# Patient Record
Sex: Male | Born: 1955 | Race: White | Hispanic: No | Smoking: Former smoker
Health system: Southern US, Community
[De-identification: ages and names within clinical notes are randomized; demographics above are authoritative.]

## PROBLEM LIST (undated history)

## (undated) DIAGNOSIS — I4510 Unspecified right bundle-branch block: Secondary | ICD-10-CM

## (undated) DIAGNOSIS — I10 Essential (primary) hypertension: Secondary | ICD-10-CM

## (undated) DIAGNOSIS — Z8489 Family history of other specified conditions: Secondary | ICD-10-CM

## (undated) DIAGNOSIS — Z8719 Personal history of other diseases of the digestive system: Secondary | ICD-10-CM

## (undated) DIAGNOSIS — G8929 Other chronic pain: Secondary | ICD-10-CM

## (undated) DIAGNOSIS — R351 Nocturia: Secondary | ICD-10-CM

## (undated) DIAGNOSIS — M549 Dorsalgia, unspecified: Secondary | ICD-10-CM

## (undated) DIAGNOSIS — Z8711 Personal history of peptic ulcer disease: Secondary | ICD-10-CM

## (undated) DIAGNOSIS — K219 Gastro-esophageal reflux disease without esophagitis: Secondary | ICD-10-CM

## (undated) DIAGNOSIS — R531 Weakness: Secondary | ICD-10-CM

## (undated) HISTORY — PX: SPINAL FUSION: SHX223

## (undated) HISTORY — PX: KNEE ARTHROSCOPY: SUR90

## (undated) HISTORY — PX: BACK SURGERY: SHX140

## (undated) HISTORY — PX: LUMBAR FUSION: SHX111

## (undated) HISTORY — PX: ESOPHAGOGASTRODUODENOSCOPY: SHX1529

## (undated) HISTORY — DX: Essential (primary) hypertension: I10

---

## 2001-08-11 ENCOUNTER — Ambulatory Visit (HOSPITAL_COMMUNITY): Admission: RE | Admit: 2001-08-11 | Discharge: 2001-08-11 | Payer: Self-pay | Admitting: Family Medicine

## 2001-08-11 ENCOUNTER — Encounter: Payer: Self-pay | Admitting: Family Medicine

## 2001-08-18 ENCOUNTER — Encounter: Payer: Self-pay | Admitting: Family Medicine

## 2001-08-18 ENCOUNTER — Ambulatory Visit (HOSPITAL_COMMUNITY): Admission: RE | Admit: 2001-08-18 | Discharge: 2001-08-18 | Payer: Self-pay | Admitting: Family Medicine

## 2003-10-19 ENCOUNTER — Emergency Department (HOSPITAL_COMMUNITY): Admission: EM | Admit: 2003-10-19 | Discharge: 2003-10-19 | Payer: Self-pay | Admitting: Emergency Medicine

## 2013-05-16 ENCOUNTER — Emergency Department (HOSPITAL_COMMUNITY)
Admission: EM | Admit: 2013-05-16 | Discharge: 2013-05-16 | Disposition: A | Discharge status: Home / Self Care | Payer: BC Managed Care – PPO | Attending: Emergency Medicine | Admitting: Emergency Medicine

## 2013-05-16 ENCOUNTER — Emergency Department (HOSPITAL_COMMUNITY): Payer: BC Managed Care – PPO

## 2013-05-16 ENCOUNTER — Encounter (HOSPITAL_COMMUNITY): Payer: Self-pay

## 2013-05-16 DIAGNOSIS — W108XXA Fall (on) (from) other stairs and steps, initial encounter: Secondary | ICD-10-CM | POA: Insufficient documentation

## 2013-05-16 DIAGNOSIS — S2239XA Fracture of one rib, unspecified side, initial encounter for closed fracture: Secondary | ICD-10-CM | POA: Insufficient documentation

## 2013-05-16 DIAGNOSIS — R911 Solitary pulmonary nodule: Secondary | ICD-10-CM | POA: Insufficient documentation

## 2013-05-16 DIAGNOSIS — F17200 Nicotine dependence, unspecified, uncomplicated: Secondary | ICD-10-CM | POA: Insufficient documentation

## 2013-05-16 DIAGNOSIS — R059 Cough, unspecified: Secondary | ICD-10-CM | POA: Insufficient documentation

## 2013-05-16 DIAGNOSIS — R05 Cough: Secondary | ICD-10-CM | POA: Insufficient documentation

## 2013-05-16 DIAGNOSIS — S2232XA Fracture of one rib, left side, initial encounter for closed fracture: Secondary | ICD-10-CM

## 2013-05-16 DIAGNOSIS — R918 Other nonspecific abnormal finding of lung field: Secondary | ICD-10-CM

## 2013-05-16 DIAGNOSIS — Y9389 Activity, other specified: Secondary | ICD-10-CM | POA: Insufficient documentation

## 2013-05-16 DIAGNOSIS — Z9889 Other specified postprocedural states: Secondary | ICD-10-CM | POA: Insufficient documentation

## 2013-05-16 DIAGNOSIS — Y9289 Other specified places as the place of occurrence of the external cause: Secondary | ICD-10-CM | POA: Insufficient documentation

## 2013-05-16 LAB — BASIC METABOLIC PANEL
Calcium: 9.7 mg/dL (ref 8.4–10.5)
GFR calc Af Amer: >90 mL/min (ref >90)
GFR calc non Af Amer: >90 mL/min (ref >90)
Sodium: 137 mEq/L (ref 135–145)

## 2013-05-16 MED ORDER — DIAZEPAM 5 MG PO TABS
10.0000 mg | ORAL_TABLET | Freq: Once | ORAL | Status: AC
  Administered 2013-05-16: 10 mg via ORAL
  Filled 2013-05-16: qty 2

## 2013-05-16 MED ORDER — METHOCARBAMOL 500 MG PO TABS: 500.0000 mg | ORAL_TABLET | Freq: Three times a day (TID) | ORAL | Status: DC

## 2013-05-16 MED ORDER — HYDROCODONE-ACETAMINOPHEN 7.5-325 MG PO TABS: 1.0000 | ORAL_TABLET | ORAL | Status: DC | PRN

## 2013-05-16 MED ORDER — HYDROCODONE-ACETAMINOPHEN 5-325 MG PO TABS
2.0000 | ORAL_TABLET | Freq: Once | ORAL | Status: AC
  Administered 2013-05-16: 2 via ORAL
  Filled 2013-05-16: qty 2

## 2013-05-16 MED ORDER — IOHEXOL 300 MG/ML  SOLN
80.0000 mL | Freq: Once | INTRAMUSCULAR | Status: AC | PRN
  Administered 2013-05-16: 80 mL via INTRAVENOUS

## 2013-05-16 NOTE — ED Provider Notes (Signed)
CSN: [id number]     Arrival date & time 05/16/13  4098 History   First MD Initiated Contact with Patient 05/16/13 0830     Chief Complaint  Patient presents with  . Back Pain   (Consider location/radiation/quality/duration/timing/severity/associated sxs/prior Treatment) Patient is a 57 y.o. male presenting with flank pain. The history is provided by the patient.  Flank Pain This is a new problem. The current episode started in the past 7 days. The problem occurs constantly. The problem has been gradually worsening. Associated symptoms include arthralgias and coughing. Pertinent negatives include no abdominal pain, chest pain, fever, headaches, nausea, neck pain, numbness or vomiting. The symptoms are aggravated by bending, twisting and walking. He has tried acetaminophen and rest for the symptoms.    History reviewed. No pertinent past medical history. Past Surgical History  Procedure Laterality Date  . Back surgery     No family history on file. History  Substance Use Topics  . Smoking status: Current Every Day Smoker  . Smokeless tobacco: Not on file  . Alcohol Use: Yes     Comment: 4-5 beers daily    Review of Systems  Constitutional: Negative for fever and activity change.       All ROS Neg except as noted in HPI  HENT: Negative for nosebleeds and neck pain.   Eyes: Negative for photophobia and discharge.  Respiratory: Positive for cough. Negative for shortness of breath and wheezing.   Cardiovascular: Negative for chest pain and palpitations.  Gastrointestinal: Negative for nausea, vomiting, abdominal pain and blood in stool.  Genitourinary: Positive for flank pain. Negative for dysuria, frequency and hematuria.  Musculoskeletal: Positive for back pain and arthralgias.  Skin: Negative.   Neurological: Negative for dizziness, seizures, speech difficulty, numbness and headaches.  Psychiatric/Behavioral: Negative for hallucinations and confusion.    Allergies  Review of  patient's allergies indicates no known allergies.  Home Medications   Current Outpatient Rx  Name  Route  Sig  Dispense  Refill  . diphenhydramine-acetaminophen (TYLENOL PM) 25-500 MG TABS   Oral   Take 1 tablet by mouth at bedtime as needed (sleep/pain).          BP 166/96  Pulse 82  Temp(Src) 97.7 F (36.5 C) (Oral)  Resp 18  SpO2 96% Physical Exam  Nursing note and vitals reviewed. Constitutional: He is oriented to person, place, and time. He appears well-developed and well-nourished.  Non-toxic appearance.  HENT:  Head: Normocephalic.  Right Ear: Tympanic membrane and external ear normal.  Left Ear: Tympanic membrane and external ear normal.  Eyes: EOM and lids are normal. Pupils are equal, round, and reactive to light.  Neck: Normal range of motion. Neck supple. Carotid bruit is not present.  Cardiovascular: Normal rate, regular rhythm, normal heart sounds, intact distal pulses and normal pulses.   Pulmonary/Chest: No respiratory distress. He has decreased breath sounds in the left lower field.  Decrease breath sound at the base of the left lung.  Abdominal: Soft. Bowel sounds are normal. There is no tenderness. There is no guarding.  Musculoskeletal: Normal range of motion.       Arms: Lymphadenopathy:       Head (right side): No submandibular adenopathy present.       Head (left side): No submandibular adenopathy present.    He has no cervical adenopathy.  Neurological: He is alert and oriented to person, place, and time. He has normal strength. No cranial nerve deficit or sensory deficit.  Skin: Skin is  warm and dry.  Psychiatric: He has a normal mood and affect. His speech is normal.    ED Course  Procedures (including critical care time) Labs Review Labs Reviewed - No data to display Imaging Review Dg Ribs Unilateral W/chest Left  05/16/2013   CLINICAL DATA:  Fall. Left lower posterior rib pain.  EXAM: LEFT RIBS AND CHEST - 3+ VIEW  COMPARISON:  10/19/2003  chest radiograph  FINDINGS: Abnormal band of opacity noted in the right mid lung. Thoracic spondylosis noted. 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  Suspected acute left 11th rib fracture posteriorly  No pneumothorax or pleural effusion.  IMPRESSION: 1. Suspected acute left 11th rib fracture posteriorly. 2. Bandlike opacity in the right mid lung. 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 The appearance is more striking than on the prior exam, and CT scan of the chest with contrast is recommended to exclude malignancy.   Electronically Signed   By: Herbie Baltimore   On: 05/16/2013 09:08    MDM  No diagnosis found. *I have reviewed nursing notes, vital signs, and all appropriate lab and imaging results for this patient.**  Pt sustained a fall 4 days ago and back/flank pain is getting worse. There is pain with movement and pain with deep breathing. Chest xray suggested a bandlike opacity in the right mild lung. Xray of the rib suggest fx of the 11th posterior rib. CT scan notes small nodules of the subpleural aspect of the RML. Repeat CT was suggested in 6 months. Results given to the patient. Pain improving with vlium and norco. Rx for norco and robaxin given to the patient. He will have his PCP follow up with the reveat CT scans.  Kathie Dike, PA-C 05/18/13 1758   Medical screening examination/treatment/procedure(s) were performed by non-physician practitioner and as supervising physician I was immediately available for consultation/collaboration.  Donnetta Hutching, MD 06/11/13 1053

## 2013-05-16 NOTE — ED Notes (Signed)
Pt reports was going up 2 stairs Tuesday night and left knee gave out and he fell backwards.  Reports knee has been "giving out" for the past 2 months.  PT c/o pain in posterior ribs.  Pain much worse with movement and deep breathing.  Denies any SOB.

## 2013-06-04 ENCOUNTER — Encounter: Payer: Self-pay | Admitting: Orthopedic Surgery

## 2013-06-04 ENCOUNTER — Ambulatory Visit (INDEPENDENT_AMBULATORY_CARE_PROVIDER_SITE_OTHER): Payer: BC Managed Care – PPO

## 2013-06-04 ENCOUNTER — Ambulatory Visit (INDEPENDENT_AMBULATORY_CARE_PROVIDER_SITE_OTHER): Payer: BC Managed Care – PPO | Admitting: Orthopedic Surgery

## 2013-06-04 VITALS — BP 162/101 | Ht 72.0 in | Wt 212.0 lb

## 2013-06-04 DIAGNOSIS — M25562 Pain in left knee: Secondary | ICD-10-CM

## 2013-06-04 DIAGNOSIS — M25569 Pain in unspecified knee: Secondary | ICD-10-CM

## 2013-06-04 DIAGNOSIS — M6250 Muscle wasting and atrophy, not elsewhere classified, unspecified site: Secondary | ICD-10-CM

## 2013-06-04 DIAGNOSIS — R29898 Other symptoms and signs involving the musculoskeletal system: Secondary | ICD-10-CM

## 2013-06-04 DIAGNOSIS — M62569 Muscle wasting and atrophy, not elsewhere classified, unspecified lower leg: Secondary | ICD-10-CM | POA: Insufficient documentation

## 2013-06-04 NOTE — Progress Notes (Signed)
  Subjective:    Austin Calderon is a 57 y.o. male who presents with gradual onset over the last 3-4 months of giving out symptoms and pain in his left knee. He seems to get better after walking. Pain is worse at night. He reports a sharp pain intermittently in his knee shooting through both sides which will last a couple of days finger way. No signs of trauma. The following portions of the patient's history were reviewed and updated as appropriate: allergies, current medications, past family history, past medical history, past social history, past surgical history and problem list.   Review of Systems A comprehensive review of systems was negative.   Objective:    BP 162/101  Ht 6' (1.829 m)  Wt 212 lb (96.163 kg)  BMI 28.75 kg/m2 General appearance is normal, the patient is alert and oriented x3 with normal mood and affect. Is a mature status is normal no assisted devices no bracing  His left leg shows atrophy of the half inch compared to the right has normal range of motion stability in the left leg with normal skin negative McMurray sign normal cardiovascular signs and normal sensation, we DC atrophy.        X-ray left knee: shows DJD changes, likely chronic    Assessment:   Encounter Diagnoses  Name Primary?  . Left knee pain Yes  . Weakness of left leg   . Muscle wasting and atrophy, NEC, unsp lower leg      Plan:   He has weakness and atrophy in the left quadriceps which would explain his normal ligament exam negative cartilage tear exam and giving out episodes which is most likely related to his lumbar spine problem  Recommend home exercises

## 2013-06-04 NOTE — Patient Instructions (Signed)
Knee Exercises EXERCISES RANGE OF MOTION(ROM) AND STRETCHING EXERCISES These exercises may help you when beginning to rehabilitate your injury. Your symptoms may resolve with or without further involvement from your physician, physical therapist or athletic trainer. While completing these exercises, remember:    Restoring tissue flexibility helps normal motion to return to the joints. This allows healthier, less painful movement and activity.   An effective stretch should be held for at least 30 seconds.   A stretch should never be painful. You should only feel a gentle lengthening or release in the stretched tissue.  STRETCH - Knee Extension, Prone  Lie on your stomach on a firm surface, such as a bed or countertop. Place your right / left knee and leg just beyond the edge of the surface. You may wish to place a towel under the far end of your right / left thigh for comfort.   Relax your leg muscles and allow gravity to straighten your knee. Your clinician may advise you to add an ankle weight if more resistance is helpful for you.  You should feel a stretch in the back of your right / left knee.  RANGE OF MOTION - Knee Flexion, Active  Lie on your back with both knees straight. (If this causes back discomfort, bend your opposite knee, placing your foot flat on the floor.)   Slowly slide your heel back toward your buttocks until you feel a gentle stretch in the front of your knee or thigh.     STRETCH - Quadriceps, Prone   Lie on your stomach on a firm surface, such as a bed or padded floor.   Bend your right / left knee and grasp your ankle. If you are unable to reach, your ankle or pant leg, use a belt around your foot to lengthen your reach.   Gently pull your heel toward your buttocks. Your knee should not slide out to the side. You should feel a stretch in the front of your thigh and/or knee.   STRETCH  Hamstrings, Supine   Lie on your back. Loop a belt or towel over the ball of  your right / left foot.   Straighten your right / left knee and slowly pull on the belt to raise your leg. Do not allow the right / left knee to bend. Keep your opposite leg flat on the floor.  Raise the leg until you feel a gentle stretch behind your right / left knee or thigh.  STRENGTHENING EXERCISES These exercises may help you when beginning to rehabilitate your injury. They may resolve your symptoms with or without further involvement from your physician, physical therapist or athletic trainer. While completing these exercises, remember:    Muscles can gain both the endurance and the strength needed for everyday activities through controlled exercises.   Complete these exercises as instructed by your physician, physical therapist or athletic trainer. Progress the resistance and repetitions only as guided.   You may experience muscle soreness or fatigue, but the pain or discomfort you are trying to eliminate should never worsen during these exercises. If this pain does worsen, stop and make certain you are following the directions exactly. If the pain is still present after adjustments, discontinue the exercise until you can discuss the trouble with your clinician.  STRENGTH - Quadriceps, Isometrics  Lie on your back with your right / left leg extended and your opposite knee bent.   Gradually tense the muscles in the front of your right / left   thigh. You should see either your knee cap slide up toward your hip or increased dimpling just above the knee. This motion will push the back of the knee down toward the floor/mat/bed on which you are lying.   STRENGTH - Quadriceps, Short Arcs   Lie on your back. Place a rolled  towel roll under your knee so that the knee slightly bends.   Raise only your lower leg by tightening the muscles in the front of your thigh. Do not allow your thigh to rise.     STRENGTH - Quadriceps, Straight Leg Raises  Quality counts! Watch for signs that the  quadriceps muscle is working to insure you are strengthening the correct muscles and not "cheating" by substituting with healthier muscles.  Lay on your back with your right / left leg extended and your opposite knee bent.   Tense the muscles in the front of your right / left thigh. You should see either your knee cap slide up or increased dimpling just above the knee. Your thigh may even quiver.  Tighten these muscles even more and raise your leg 4 to 6 inches off the floor.   STRENGTH - Hamstring, Curls  Lay on your stomach with your legs extended. (If you lay on a bed, your feet may hang over the edge.)   Tighten the muscles in the back of your thigh to bend your right / left knee up to 90 degrees. Keep your hips flat on the bed/floor.    STRENGTH  Quadriceps, Squats  Stand in a door frame so that your feet and knees are in line with the frame.   Use your hands for balance, not support, on the frame.   Slowly lower your weight, bending at the hips and knees. Keep your lower legs upright so that they are parallel with the door frame. Squat only within the range that does not increase your knee pain. Never let your hips drop below your knees.   Slowly return upright, pushing with your legs, not pulling with your hands.   STRENGTH - Quadriceps, Wall Slides  Follow guidelines for form closely. Increased knee pain often results from poorly placed feet or knees.  Lean against a smooth wall or door and walk your feet out 18-24 inches. Place your feet hip-width apart.   Slowly slide down the wall or door until your knees bend _30________ degrees.* Keep your knees over your heels, not your toes, and in line with your hips, not falling to either side.   * Your physician, physical therapist or athletic trainer will alter this angle based on your symptoms and progress. Document Released: 07/11/2005 Document Revised: 11/19/2011 Document Reviewed: 12/09/2008 [employer] Patient Information 2013  [employer]    

## 2014-01-11 ENCOUNTER — Ambulatory Visit (HOSPITAL_COMMUNITY)
Admission: RE | Admit: 2014-01-11 | Discharge: 2014-01-11 | Disposition: A | Discharge status: Home / Self Care | Payer: BC Managed Care – PPO | Source: Ambulatory Visit | Attending: Family Medicine | Admitting: Family Medicine

## 2014-01-11 ENCOUNTER — Other Ambulatory Visit (HOSPITAL_COMMUNITY): Payer: Self-pay | Admitting: Family Medicine

## 2014-01-11 DIAGNOSIS — R509 Fever, unspecified: Secondary | ICD-10-CM | POA: Insufficient documentation

## 2014-01-11 DIAGNOSIS — J209 Acute bronchitis, unspecified: Secondary | ICD-10-CM

## 2014-01-11 DIAGNOSIS — R05 Cough: Secondary | ICD-10-CM | POA: Insufficient documentation

## 2014-01-11 DIAGNOSIS — R059 Cough, unspecified: Secondary | ICD-10-CM | POA: Insufficient documentation

## 2014-06-29 ENCOUNTER — Other Ambulatory Visit (HOSPITAL_COMMUNITY): Payer: Self-pay | Admitting: Sports Medicine

## 2014-06-29 DIAGNOSIS — M545 Low back pain: Secondary | ICD-10-CM

## 2014-06-29 DIAGNOSIS — M79604 Pain in right leg: Secondary | ICD-10-CM

## 2014-07-01 ENCOUNTER — Encounter (HOSPITAL_COMMUNITY): Payer: Self-pay

## 2014-07-01 ENCOUNTER — Ambulatory Visit (HOSPITAL_COMMUNITY)
Admission: RE | Admit: 2014-07-01 | Discharge: 2014-07-01 | Disposition: A | Discharge status: Home / Self Care | Payer: BC Managed Care – PPO | Source: Ambulatory Visit | Attending: Sports Medicine | Admitting: Sports Medicine

## 2014-07-01 DIAGNOSIS — M4806 Spinal stenosis, lumbar region: Secondary | ICD-10-CM | POA: Insufficient documentation

## 2014-07-01 DIAGNOSIS — Z981 Arthrodesis status: Secondary | ICD-10-CM | POA: Diagnosis not present

## 2014-07-01 DIAGNOSIS — M545 Low back pain: Secondary | ICD-10-CM | POA: Diagnosis present

## 2014-07-01 DIAGNOSIS — M79604 Pain in right leg: Secondary | ICD-10-CM

## 2014-07-01 MED ORDER — GADOBENATE DIMEGLUMINE 529 MG/ML IV SOLN
20.0000 mL | Freq: Once | INTRAVENOUS | Status: AC | PRN
  Administered 2014-07-01: 20 mL via INTRAVENOUS

## 2014-08-17 ENCOUNTER — Other Ambulatory Visit: Payer: Self-pay | Admitting: Neurosurgery

## 2014-08-17 DIAGNOSIS — S32009K Unspecified fracture of unspecified lumbar vertebra, subsequent encounter for fracture with nonunion: Secondary | ICD-10-CM

## 2014-08-24 ENCOUNTER — Ambulatory Visit
Admission: RE | Admit: 2014-08-24 | Discharge: 2014-08-24 | Disposition: A | Discharge status: Home / Self Care | Payer: BC Managed Care – PPO | Source: Ambulatory Visit | Attending: Neurosurgery | Admitting: Neurosurgery

## 2014-08-24 ENCOUNTER — Other Ambulatory Visit: Payer: BC Managed Care – PPO

## 2014-08-24 DIAGNOSIS — R29898 Other symptoms and signs involving the musculoskeletal system: Secondary | ICD-10-CM

## 2014-08-24 DIAGNOSIS — S32009K Unspecified fracture of unspecified lumbar vertebra, subsequent encounter for fracture with nonunion: Secondary | ICD-10-CM

## 2014-08-24 DIAGNOSIS — M25562 Pain in left knee: Secondary | ICD-10-CM

## 2014-08-24 MED ORDER — IOHEXOL 180 MG/ML  SOLN
15.0000 mL | Freq: Once | INTRAMUSCULAR | Status: AC | PRN
  Administered 2014-08-24: 15 mL via INTRATHECAL

## 2014-08-24 MED ORDER — DIAZEPAM 5 MG PO TABS
10.0000 mg | ORAL_TABLET | Freq: Once | ORAL | Status: AC
  Administered 2014-08-24: 10 mg via ORAL

## 2014-08-24 NOTE — Discharge Instructions (Signed)

## 2014-08-26 ENCOUNTER — Other Ambulatory Visit: Payer: BC Managed Care – PPO

## 2014-10-04 ENCOUNTER — Other Ambulatory Visit: Payer: Self-pay | Admitting: Neurosurgery

## 2014-10-07 ENCOUNTER — Encounter (HOSPITAL_COMMUNITY): Payer: Self-pay

## 2014-10-07 ENCOUNTER — Other Ambulatory Visit: Payer: Self-pay

## 2014-10-07 ENCOUNTER — Encounter (HOSPITAL_COMMUNITY)
Admission: RE | Admit: 2014-10-07 | Discharge: 2014-10-07 | Disposition: A | Discharge status: Home / Self Care | Payer: BLUE CROSS/BLUE SHIELD | Source: Ambulatory Visit | Attending: Neurosurgery | Admitting: Neurosurgery

## 2014-10-07 HISTORY — DX: Gastro-esophageal reflux disease without esophagitis: K21.9

## 2014-10-07 HISTORY — DX: Weakness: R53.1

## 2014-10-07 HISTORY — DX: Family history of other specified conditions: Z84.89

## 2014-10-07 HISTORY — DX: Personal history of other diseases of the digestive system: Z87.19

## 2014-10-07 HISTORY — DX: Nocturia: R35.1

## 2014-10-07 HISTORY — DX: Other chronic pain: G89.29

## 2014-10-07 HISTORY — DX: Dorsalgia, unspecified: M54.9

## 2014-10-07 HISTORY — DX: Personal history of peptic ulcer disease: Z87.11

## 2014-10-07 LAB — CBC WITH DIFFERENTIAL/PLATELET
Basophils Absolute: 0.1 10*3/uL (ref 0.0–0.1)
Basophils Relative: 1 % (ref 0–1)
EOS PCT: 3 % (ref 0–5)
Eosinophils Absolute: 0.3 10*3/uL (ref 0.0–0.7)
HCT: 43.8 % (ref 39.0–52.0)
HEMOGLOBIN: 14.7 g/dL (ref 13.0–17.0)
LYMPHS ABS: 2.8 10*3/uL (ref 0.7–4.0)
Lymphocytes Relative: 30 % (ref 12–46)
MCH: 32.3 pg (ref 26.0–34.0)
MCHC: 33.6 g/dL (ref 30.0–36.0)
MCV: 96.3 fL (ref 78.0–100.0)
Monocytes Absolute: 0.8 10*3/uL (ref 0.1–1.0)
Monocytes Relative: 9 % (ref 3–12)
Neutro Abs: 5.4 10*3/uL (ref 1.7–7.7)
Neutrophils Relative %: 57 % (ref 43–77)
Platelets: 259 10*3/uL (ref 150–400)
RBC: 4.55 MIL/uL (ref 4.22–5.81)
RDW: 12.4 % (ref 11.5–15.5)
WBC: 9.5 10*3/uL (ref 4.0–10.5)

## 2014-10-07 LAB — BASIC METABOLIC PANEL
Anion gap: 8 (ref 5–15)
BUN: 10 mg/dL (ref 6–23)
CO2: 27 mmol/L (ref 19–32)
Calcium: 9.3 mg/dL (ref 8.4–10.5)
Chloride: 104 mmol/L (ref 96–112)
Creatinine, Ser: 1.07 mg/dL (ref 0.50–1.35)
GFR calc Af Amer: 87 mL/min — ABNORMAL LOW (ref >90)
GFR, EST NON AFRICAN AMERICAN: 75 mL/min — AB (ref >90)
Glucose, Bld: 107 mg/dL — ABNORMAL HIGH (ref 70–99)
Potassium: 4.2 mmol/L (ref 3.5–5.1)
Sodium: 139 mmol/L (ref 135–145)

## 2014-10-07 LAB — TYPE AND SCREEN
ABO/RH(D): O NEG
Antibody Screen: NEGATIVE

## 2014-10-07 LAB — SURGICAL PCR SCREEN
MRSA, PCR: NEGATIVE
STAPHYLOCOCCUS AUREUS: NEGATIVE

## 2014-10-07 LAB — ABO/RH: ABO/RH(D): O NEG

## 2014-10-07 NOTE — Pre-Procedure Instructions (Signed)
Austin Calderon  10/07/2014   Your procedure is scheduled on:  Mon, Feb 1 @ 7:45 AM  Report to [facility] at 5:30 AM.  Call this number if you have problems the morning of surgery: [phone]   Remember:   Do not eat food or drink liquids after midnight.   Take these medicines the morning of surgery with A SIP OF WATER: Pain Pill(if needed)               No Goody's,BC's,Aleve,Aspirin,Ibuprofen,Fish Oil,or any Herbal Medications   Do not wear jewelry  Do not wear lotions, powders, or colognes. You may wear deodorant.  Men may shave face and neck.  Do not bring valuables to the hospital.  [facility] is not responsible                  for any belongings or valuables.               Contacts, dentures or bridgework may not be worn into surgery.  Leave suitcase in the car. After surgery it may be brought to your room.  For patients admitted to the hospital, discharge time is determined by your                treatment team.                 Special Instructions:   - Preparing for Surgery  Before surgery, you can play an important role.  Because skin is not sterile, your skin needs to be as free of germs as possible.  You can reduce the number of germs on you skin by washing with CHG (chlorahexidine gluconate) soap before surgery.  CHG is an antiseptic cleaner which kills germs and bonds with the skin to continue killing germs even after washing.  Please DO NOT use if you have an allergy to CHG or antibacterial soaps.  If your skin becomes reddened/irritated stop using the CHG and inform your nurse when you arrive at Short Stay.  Do not shave (including legs and underarms) for at least 48 hours prior to the first CHG shower.  You may shave your face.  Please follow these instructions carefully:   1.  Shower with CHG Soap the night before surgery and the                                morning of Surgery.  2.  If you choose to wash your hair, wash your hair first  as usual with your       normal shampoo.  3.  After you shampoo, rinse your hair and body thoroughly to remove the                      Shampoo.  4.  Use CHG as you would any other liquid soap.  You can apply chg directly       to the skin and wash gently with scrungie or a clean washcloth.  5.  Apply the CHG Soap to your body ONLY FROM THE NECK DOWN.        Do not use on open wounds or open sores.  Avoid contact with your eyes,       ears, mouth and genitals (private parts).  Wash genitals (private parts)       with your normal soap.  6.  Wash thoroughly, paying special  attention to the area where your surgery        will be performed.  7.  Thoroughly rinse your body with warm water from the neck down.  8.  DO NOT shower/wash with your normal soap after using and rinsing off       the CHG Soap.  9.  Pat yourself dry with a clean towel.            10.  Wear clean pajamas.            11.  Place clean sheets on your bed the night of your first shower and do not        sleep with pets.  Day of Surgery  Do not apply any lotions/deoderants the morning of surgery.  Please wear clean clothes to the hospital/surgery center.     Please read over the following fact sheets that you were given: Pain Booklet, Coughing and Deep Breathing, Blood Transfusion Information, MRSA Information and Surgical Site Infection Prevention

## 2014-10-07 NOTE — Progress Notes (Signed)
Pt doesn't have a cardiologist  Medical Md is Dr.Fusco   Denies EKG in past yr  CXR in epic from 01-11-14  Denies ever having an echo/stress test/heart cath

## 2014-10-07 NOTE — Pre-Procedure Instructions (Signed)
Austin Calderon  10/07/2014   Your procedure is scheduled on:  Monday, Feb. 1, 2016   Report to [facility] Admitting at 5:30 AM.  Call this number if you have problems the morning of surgery: [phone]   Remember:   Do not eat food or drink liquids after midnight Sunday.   Take these medicines the morning of surgery with A SIP OF WATER: Robaxin, Norco.   Do not wear jewelry - no rings or watches.  Do not wear lotions or colognes.  You may NOT wear deodorant the day surgery.   Men may shave face and neck.  Do not bring valuables to the hospital.  [facility] is not responsible for any belongings or valuables.               Contacts, dentures or bridgework may not be worn into surgery.  Leave suitcase in the car. After surgery it may be brought to your room.  For patients admitted to the hospital, discharge time is determined by your treatment team.    Name and phone number of your driver:    Special Instructions: "Preparing for Surgery" instruction sheet.   Please read over the following fact sheets that you were given: Pain Booklet, Coughing and Deep Breathing, Blood Transfusion Information, MRSA Information and Surgical Site Infection Prevention

## 2014-10-08 NOTE — Progress Notes (Signed)
Anesthesia Chart Review:  Patient is a 59 year old male scheduled for L4-5 PLIF on 10/11/14 by Dr. Annette Stable.    History includes former smoker, HTN, GERD, spinal fusion. He admits to drinking 4-5 beers per day. PCP is Dr. Gerarda Fraction.    10/07/14 EKG: NSR with sinus arrhythmia, right BBB. No CV symptoms documented at his PAT visit.  Denied prior echo, stress or cath.  No comparison EKGs in [place], [place], or at [facility].   01/11/14 CXR: No active cardiopulmonary disease.  Preoperative labs noted.   Patient with right BBB of undetermined durations. No known CAD/MI/CHF or DM history.  Further evaluation by his assigned anesthesiologist on the day of surgery, but if no acute changes or new CV symptoms then I would anticipate that he could proceed as planned.  George [facility] Short Stay Center/Anesthesiology Phone [phone] 10/08/2014 11:39 AM

## 2014-10-10 MED ORDER — CEFAZOLIN SODIUM-DEXTROSE 2-3 GM-% IV SOLR
2.0000 g | INTRAVENOUS | Status: AC
  Administered 2014-10-11: 2 g via INTRAVENOUS
  Filled 2014-10-10: qty 50

## 2014-10-10 MED ORDER — DEXAMETHASONE SODIUM PHOSPHATE 10 MG/ML IJ SOLN
10.0000 mg | INTRAMUSCULAR | Status: AC
  Administered 2014-10-11: 10 mg via INTRAVENOUS
  Filled 2014-10-10: qty 1

## 2014-10-11 ENCOUNTER — Inpatient Hospital Stay (HOSPITAL_COMMUNITY): Payer: BLUE CROSS/BLUE SHIELD | Admitting: Vascular Surgery

## 2014-10-11 ENCOUNTER — Encounter (HOSPITAL_COMMUNITY)
Admission: RE | Disposition: A | Discharge status: Home / Self Care | Payer: Self-pay | Source: Ambulatory Visit | Attending: Neurosurgery

## 2014-10-11 ENCOUNTER — Inpatient Hospital Stay (HOSPITAL_COMMUNITY): Payer: BLUE CROSS/BLUE SHIELD | Admitting: Anesthesiology

## 2014-10-11 ENCOUNTER — Inpatient Hospital Stay (HOSPITAL_COMMUNITY)
Admission: RE | Admit: 2014-10-11 | Discharge: 2014-10-12 | DRG: 460 | Disposition: A | Discharge status: Home / Self Care | Payer: BLUE CROSS/BLUE SHIELD | Source: Ambulatory Visit | Attending: Neurosurgery | Admitting: Neurosurgery

## 2014-10-11 ENCOUNTER — Encounter (HOSPITAL_COMMUNITY): Payer: Self-pay | Admitting: *Deleted

## 2014-10-11 ENCOUNTER — Inpatient Hospital Stay (HOSPITAL_COMMUNITY): Payer: BLUE CROSS/BLUE SHIELD

## 2014-10-11 DIAGNOSIS — I10 Essential (primary) hypertension: Secondary | ICD-10-CM | POA: Diagnosis present

## 2014-10-11 DIAGNOSIS — Y838 Other surgical procedures as the cause of abnormal reaction of the patient, or of later complication, without mention of misadventure at the time of the procedure: Secondary | ICD-10-CM | POA: Diagnosis present

## 2014-10-11 DIAGNOSIS — S32009K Unspecified fracture of unspecified lumbar vertebra, subsequent encounter for fracture with nonunion: Secondary | ICD-10-CM | POA: Diagnosis present

## 2014-10-11 DIAGNOSIS — M960 Pseudarthrosis after fusion or arthrodesis: Principal | ICD-10-CM | POA: Diagnosis present

## 2014-10-11 DIAGNOSIS — K219 Gastro-esophageal reflux disease without esophagitis: Secondary | ICD-10-CM | POA: Diagnosis present

## 2014-10-11 DIAGNOSIS — Z419 Encounter for procedure for purposes other than remedying health state, unspecified: Secondary | ICD-10-CM

## 2014-10-11 DIAGNOSIS — G8929 Other chronic pain: Secondary | ICD-10-CM | POA: Diagnosis present

## 2014-10-11 DIAGNOSIS — M4806 Spinal stenosis, lumbar region: Secondary | ICD-10-CM | POA: Diagnosis present

## 2014-10-11 DIAGNOSIS — I4510 Unspecified right bundle-branch block: Secondary | ICD-10-CM | POA: Diagnosis present

## 2014-10-11 DIAGNOSIS — M549 Dorsalgia, unspecified: Secondary | ICD-10-CM | POA: Diagnosis present

## 2014-10-11 DIAGNOSIS — M5489 Other dorsalgia: Secondary | ICD-10-CM | POA: Diagnosis present

## 2014-10-11 DIAGNOSIS — Z87891 Personal history of nicotine dependence: Secondary | ICD-10-CM

## 2014-10-11 DIAGNOSIS — Z981 Arthrodesis status: Secondary | ICD-10-CM

## 2014-10-11 HISTORY — DX: Unspecified right bundle-branch block: I45.10

## 2014-10-11 SURGERY — POSTERIOR LUMBAR FUSION 1 LEVEL
Anesthesia: General | Site: Spine Lumbar

## 2014-10-11 MED ORDER — VANCOMYCIN HCL 1000 MG IV SOLR
INTRAVENOUS | Status: AC
  Filled 2014-10-11: qty 1000

## 2014-10-11 MED ORDER — MIDAZOLAM HCL 2 MG/2ML IJ SOLN
INTRAMUSCULAR | Status: AC
  Filled 2014-10-11: qty 2

## 2014-10-11 MED ORDER — SODIUM CHLORIDE 0.9 % IJ SOLN
3.0000 mL | Freq: Two times a day (BID) | INTRAMUSCULAR | Status: DC
  Administered 2014-10-11 (×2): 3 mL via INTRAVENOUS

## 2014-10-11 MED ORDER — LACTATED RINGERS IV SOLN
INTRAVENOUS | Status: DC | PRN
  Administered 2014-10-11 (×3): via INTRAVENOUS

## 2014-10-11 MED ORDER — 0.9 % SODIUM CHLORIDE (POUR BTL) OPTIME
TOPICAL | Status: DC | PRN
  Administered 2014-10-11: 1000 mL

## 2014-10-11 MED ORDER — BUPIVACAINE HCL (PF) 0.25 % IJ SOLN
INTRAMUSCULAR | Status: DC | PRN
  Administered 2014-10-11: 20 mL

## 2014-10-11 MED ORDER — ONDANSETRON HCL 4 MG/2ML IJ SOLN
INTRAMUSCULAR | Status: DC | PRN
  Administered 2014-10-11: 4 mg via INTRAVENOUS

## 2014-10-11 MED ORDER — LIDOCAINE HCL (CARDIAC) 20 MG/ML IV SOLN
INTRAVENOUS | Status: AC
  Filled 2014-10-11: qty 5

## 2014-10-11 MED ORDER — ARTIFICIAL TEARS OP OINT
TOPICAL_OINTMENT | OPHTHALMIC | Status: DC | PRN
  Administered 2014-10-11: 1 via OPHTHALMIC

## 2014-10-11 MED ORDER — PROPOFOL 10 MG/ML IV BOLUS
INTRAVENOUS | Status: DC | PRN
  Administered 2014-10-11: 200 mg via INTRAVENOUS

## 2014-10-11 MED ORDER — OXYCODONE HCL 5 MG/5ML PO SOLN: 5.0000 mg | Freq: Once | ORAL | Status: DC | PRN

## 2014-10-11 MED ORDER — MENTHOL 3 MG MT LOZG: 1.0000 | LOZENGE | OROMUCOSAL | Status: DC | PRN

## 2014-10-11 MED ORDER — OXYCODONE-ACETAMINOPHEN 5-325 MG PO TABS
ORAL_TABLET | ORAL | Status: AC
  Administered 2014-10-11: 2 via ORAL
  Filled 2014-10-11: qty 2

## 2014-10-11 MED ORDER — SENNA 8.6 MG PO TABS
1.0000 | ORAL_TABLET | Freq: Two times a day (BID) | ORAL | Status: DC
  Administered 2014-10-11: 8.6 mg via ORAL
  Filled 2014-10-11 (×3): qty 1

## 2014-10-11 MED ORDER — ACETAMINOPHEN 325 MG PO TABS: 650.0000 mg | ORAL_TABLET | ORAL | Status: DC | PRN

## 2014-10-11 MED ORDER — ALUM & MAG HYDROXIDE-SIMETH 200-200-20 MG/5ML PO SUSP: 30.0000 mL | Freq: Four times a day (QID) | ORAL | Status: DC | PRN

## 2014-10-11 MED ORDER — FENTANYL CITRATE 0.05 MG/ML IJ SOLN
INTRAMUSCULAR | Status: AC
  Filled 2014-10-11: qty 5

## 2014-10-11 MED ORDER — ARTIFICIAL TEARS OP OINT
TOPICAL_OINTMENT | OPHTHALMIC | Status: AC
  Filled 2014-10-11: qty 3.5

## 2014-10-11 MED ORDER — DIAZEPAM 5 MG PO TABS
5.0000 mg | ORAL_TABLET | Freq: Four times a day (QID) | ORAL | Status: DC | PRN
Start: 2014-10-11 — End: 2014-10-12
  Administered 2014-10-11: 5 mg via ORAL
  Administered 2014-10-11 – 2014-10-12 (×2): 10 mg via ORAL
  Filled 2014-10-11 (×2): qty 2

## 2014-10-11 MED ORDER — THROMBIN 20000 UNITS EX SOLR
CUTANEOUS | Status: DC | PRN
  Administered 2014-10-11: 20 mL via TOPICAL

## 2014-10-11 MED ORDER — ONDANSETRON HCL 4 MG/2ML IJ SOLN: 4.0000 mg | INTRAMUSCULAR | Status: DC | PRN

## 2014-10-11 MED ORDER — DIAZEPAM 5 MG PO TABS
ORAL_TABLET | ORAL | Status: AC
  Administered 2014-10-11: 5 mg via ORAL
  Filled 2014-10-11: qty 1

## 2014-10-11 MED ORDER — ALBUMIN HUMAN 5 % IV SOLN
INTRAVENOUS | Status: DC | PRN
  Administered 2014-10-11: 09:00:00 via INTRAVENOUS

## 2014-10-11 MED ORDER — FENTANYL CITRATE 0.05 MG/ML IJ SOLN
INTRAMUSCULAR | Status: DC | PRN
  Administered 2014-10-11: 150 ug via INTRAVENOUS
  Administered 2014-10-11 (×2): 50 ug via INTRAVENOUS

## 2014-10-11 MED ORDER — FLEET ENEMA 7-19 GM/118ML RE ENEM: 1.0000 | ENEMA | Freq: Once | RECTAL | Status: AC | PRN

## 2014-10-11 MED ORDER — BACITRACIN 50000 UNITS IM SOLR
INTRAMUSCULAR | Status: DC | PRN
  Administered 2014-10-11: 500 mL

## 2014-10-11 MED ORDER — VANCOMYCIN HCL 1000 MG IV SOLR
INTRAVENOUS | Status: DC | PRN
  Administered 2014-10-11: 1000 mg via TOPICAL

## 2014-10-11 MED ORDER — ROCURONIUM BROMIDE 50 MG/5ML IV SOLN
INTRAVENOUS | Status: AC
  Filled 2014-10-11: qty 1

## 2014-10-11 MED ORDER — OXYCODONE HCL 5 MG PO TABS: 5.0000 mg | ORAL_TABLET | Freq: Once | ORAL | Status: DC | PRN

## 2014-10-11 MED ORDER — PROMETHAZINE HCL 25 MG/ML IJ SOLN
INTRAMUSCULAR | Status: AC
  Administered 2014-10-11: 12.5 mg via INTRAVENOUS
  Filled 2014-10-11: qty 1

## 2014-10-11 MED ORDER — HYDROMORPHONE HCL 1 MG/ML IJ SOLN
0.2500 mg | INTRAMUSCULAR | Status: DC | PRN
  Administered 2014-10-11 (×4): 0.5 mg via INTRAVENOUS

## 2014-10-11 MED ORDER — CEFAZOLIN SODIUM 1-5 GM-% IV SOLN
1.0000 g | Freq: Three times a day (TID) | INTRAVENOUS | Status: AC
  Administered 2014-10-11 (×2): 1 g via INTRAVENOUS
  Filled 2014-10-11 (×2): qty 50

## 2014-10-11 MED ORDER — SODIUM CHLORIDE 0.9 % IJ SOLN: 3.0000 mL | INTRAMUSCULAR | Status: DC | PRN

## 2014-10-11 MED ORDER — BISACODYL 10 MG RE SUPP: 10.0000 mg | Freq: Every day | RECTAL | Status: DC | PRN

## 2014-10-11 MED ORDER — PHENYLEPHRINE HCL 10 MG/ML IJ SOLN
INTRAMUSCULAR | Status: DC | PRN
  Administered 2014-10-11: 40 ug via INTRAVENOUS
  Administered 2014-10-11 (×2): 80 ug via INTRAVENOUS

## 2014-10-11 MED ORDER — VECURONIUM BROMIDE 10 MG IV SOLR
INTRAVENOUS | Status: DC | PRN
  Administered 2014-10-11: 3 mg via INTRAVENOUS
  Administered 2014-10-11: 2 mg via INTRAVENOUS

## 2014-10-11 MED ORDER — POLYETHYLENE GLYCOL 3350 17 G PO PACK: 17.0000 g | PACK | Freq: Every day | ORAL | Status: DC | PRN

## 2014-10-11 MED ORDER — GLYCOPYRROLATE 0.2 MG/ML IJ SOLN
INTRAMUSCULAR | Status: DC | PRN
  Administered 2014-10-11: 0.6 mg via INTRAVENOUS

## 2014-10-11 MED ORDER — NEOSTIGMINE METHYLSULFATE 10 MG/10ML IV SOLN
INTRAVENOUS | Status: DC | PRN
Start: 2014-10-11 — End: 2014-10-11
  Administered 2014-10-11: 4 mg via INTRAVENOUS

## 2014-10-11 MED ORDER — LIDOCAINE HCL (CARDIAC) 20 MG/ML IV SOLN
INTRAVENOUS | Status: DC | PRN
  Administered 2014-10-11: 100 mg via INTRAVENOUS

## 2014-10-11 MED ORDER — HYDROMORPHONE HCL 1 MG/ML IJ SOLN
0.5000 mg | INTRAMUSCULAR | Status: DC | PRN
  Administered 2014-10-11 (×2): 1 mg via INTRAVENOUS
  Filled 2014-10-11 (×2): qty 1

## 2014-10-11 MED ORDER — HYDROMORPHONE HCL 1 MG/ML IJ SOLN
INTRAMUSCULAR | Status: AC
  Administered 2014-10-11: 0.5 mg via INTRAVENOUS
  Filled 2014-10-11: qty 2

## 2014-10-11 MED ORDER — ACETAMINOPHEN 650 MG RE SUPP: 650.0000 mg | RECTAL | Status: DC | PRN

## 2014-10-11 MED ORDER — HYDROCODONE-ACETAMINOPHEN 5-325 MG PO TABS: 1.0000 | ORAL_TABLET | ORAL | Status: DC | PRN

## 2014-10-11 MED ORDER — SODIUM CHLORIDE 0.9 % IV SOLN
10.0000 mg | INTRAVENOUS | Status: DC | PRN
  Administered 2014-10-11: 25 ug/min via INTRAVENOUS

## 2014-10-11 MED ORDER — PROPOFOL 10 MG/ML IV BOLUS
INTRAVENOUS | Status: AC
  Filled 2014-10-11: qty 20

## 2014-10-11 MED ORDER — PHENOL 1.4 % MT LIQD: 1.0000 | OROMUCOSAL | Status: DC | PRN

## 2014-10-11 MED ORDER — OXYCODONE-ACETAMINOPHEN 5-325 MG PO TABS
1.0000 | ORAL_TABLET | ORAL | Status: DC | PRN
  Administered 2014-10-11 – 2014-10-12 (×4): 2 via ORAL
  Filled 2014-10-11 (×3): qty 2

## 2014-10-11 MED ORDER — ROCURONIUM BROMIDE 100 MG/10ML IV SOLN
INTRAVENOUS | Status: DC | PRN
  Administered 2014-10-11: 50 mg via INTRAVENOUS

## 2014-10-11 MED ORDER — MIDAZOLAM HCL 5 MG/5ML IJ SOLN
INTRAMUSCULAR | Status: DC | PRN
  Administered 2014-10-11: 2 mg via INTRAVENOUS

## 2014-10-11 MED ORDER — PROMETHAZINE HCL 25 MG/ML IJ SOLN
6.2500 mg | INTRAMUSCULAR | Status: DC | PRN
  Administered 2014-10-11: 12.5 mg via INTRAVENOUS

## 2014-10-11 SURGICAL SUPPLY — 66 items
APL SKNCLS STERI-STRIP NONHPOA (GAUZE/BANDAGES/DRESSINGS) ×1
BAG DECANTER FOR FLEXI CONT (MISCELLANEOUS) ×3 IMPLANT
BENZOIN TINCTURE PRP APPL 2/3 (GAUZE/BANDAGES/DRESSINGS) ×3 IMPLANT
BLADE CLIPPER SURG (BLADE) ×2 IMPLANT
BRUSH SCRUB EZ PLAIN DRY (MISCELLANEOUS) ×3 IMPLANT
BUR MATCHSTICK NEURO 3.0 LAGG (BURR) ×3 IMPLANT
CANISTER SUCT 3000ML (MISCELLANEOUS) ×3 IMPLANT
CAP LCK SPNE (Orthopedic Implant) ×4 IMPLANT
CAP LOCK SPINE RADIUS (Orthopedic Implant) IMPLANT
CAP LOCKING (Orthopedic Implant) ×12 IMPLANT
CLOSURE WOUND 1/2 X4 (GAUZE/BANDAGES/DRESSINGS) ×2
CONT SPEC 4OZ CLIKSEAL STRL BL (MISCELLANEOUS) ×6 IMPLANT
COVER BACK TABLE 60X90IN (DRAPES) ×3 IMPLANT
DECANTER SPIKE VIAL GLASS SM (MISCELLANEOUS) ×1 IMPLANT
DRAPE C-ARM 42X72 X-RAY (DRAPES) ×6 IMPLANT
DRAPE LAPAROTOMY 100X72X124 (DRAPES) ×3 IMPLANT
DRAPE POUCH INSTRU U-SHP 10X18 (DRAPES) ×3 IMPLANT
DRAPE PROXIMA HALF (DRAPES) IMPLANT
DRAPE SURG 17X23 STRL (DRAPES) ×12 IMPLANT
DRSG OPSITE POSTOP 4X8 (GAUZE/BANDAGES/DRESSINGS) ×2 IMPLANT
DURAPREP 26ML APPLICATOR (WOUND CARE) ×3 IMPLANT
ELECT REM PT RETURN 9FT ADLT (ELECTROSURGICAL) ×3
ELECTRODE REM PT RTRN 9FT ADLT (ELECTROSURGICAL) ×1 IMPLANT
EVACUATOR 1/8 PVC DRAIN (DRAIN) ×1 IMPLANT
GAUZE SPONGE 4X4 12PLY STRL (GAUZE/BANDAGES/DRESSINGS) ×1 IMPLANT
GAUZE SPONGE 4X4 16PLY XRAY LF (GAUZE/BANDAGES/DRESSINGS) IMPLANT
GLOVE BIO SURGEON STRL SZ7 (GLOVE) ×2 IMPLANT
GLOVE BIO SURGEON STRL SZ8 (GLOVE) ×2 IMPLANT
GLOVE BIOGEL PI IND STRL 7.5 (GLOVE) IMPLANT
GLOVE BIOGEL PI INDICATOR 7.5 (GLOVE) ×4
GLOVE ECLIPSE 9.0 STRL (GLOVE) ×6 IMPLANT
GLOVE EXAM NITRILE LRG STRL (GLOVE) IMPLANT
GLOVE EXAM NITRILE MD LF STRL (GLOVE) IMPLANT
GLOVE EXAM NITRILE XL STR (GLOVE) IMPLANT
GLOVE EXAM NITRILE XS STR PU (GLOVE) IMPLANT
GLOVE INDICATOR 8.5 STRL (GLOVE) ×2 IMPLANT
GLOVE SURG SS PI 7.0 STRL IVOR (GLOVE) ×6 IMPLANT
GOWN STRL REUS W/ TWL LRG LVL3 (GOWN DISPOSABLE) IMPLANT
GOWN STRL REUS W/ TWL XL LVL3 (GOWN DISPOSABLE) ×2 IMPLANT
GOWN STRL REUS W/TWL 2XL LVL3 (GOWN DISPOSABLE) IMPLANT
GOWN STRL REUS W/TWL LRG LVL3 (GOWN DISPOSABLE)
GOWN STRL REUS W/TWL XL LVL3 (GOWN DISPOSABLE) ×15
GRAFT BN 5X1XSPNE CVD POST DBM (Bone Implant) IMPLANT
GRAFT BONE MAGNIFUSE 1X5CM (Bone Implant) ×3 IMPLANT
KIT BASIN OR (CUSTOM PROCEDURE TRAY) ×3 IMPLANT
KIT INFUSE X SMALL 1.4CC (Orthopedic Implant) ×2 IMPLANT
KIT ROOM TURNOVER OR (KITS) ×3 IMPLANT
LIQUID BAND (GAUZE/BANDAGES/DRESSINGS) ×3 IMPLANT
NEEDLE HYPO 22GX1.5 SAFETY (NEEDLE) ×3 IMPLANT
NS IRRIG 1000ML POUR BTL (IV SOLUTION) ×3 IMPLANT
PACK LAMINECTOMY NEURO (CUSTOM PROCEDURE TRAY) ×3 IMPLANT
ROD RADIUS 35MM (Rod) ×4 IMPLANT
SCREW 6.75X45MM (Screw) ×4 IMPLANT
SCREW 6.75X50MM (Screw) ×4 IMPLANT
SPONGE SURGIFOAM ABS GEL 100 (HEMOSTASIS) ×3 IMPLANT
STRIP CLOSURE SKIN 1/2X4 (GAUZE/BANDAGES/DRESSINGS) ×4 IMPLANT
SUT VIC AB 0 CT1 18XCR BRD8 (SUTURE) ×2 IMPLANT
SUT VIC AB 0 CT1 8-18 (SUTURE) ×3
SUT VIC AB 2-0 CT1 18 (SUTURE) ×3 IMPLANT
SUT VIC AB 3-0 SH 8-18 (SUTURE) ×4 IMPLANT
SYR 20ML ECCENTRIC (SYRINGE) ×3 IMPLANT
TOWEL OR 17X24 6PK STRL BLUE (TOWEL DISPOSABLE) ×3 IMPLANT
TOWEL OR 17X26 10 PK STRL BLUE (TOWEL DISPOSABLE) ×3 IMPLANT
TRAY FOLEY CATH 14FRSI W/METER (CATHETERS) ×1 IMPLANT
TRAY FOLEY CATH 16FRSI W/METER (SET/KITS/TRAYS/PACK) ×2 IMPLANT
WATER STERILE IRR 1000ML POUR (IV SOLUTION) ×3 IMPLANT

## 2014-10-11 NOTE — Evaluation (Signed)
Physical Therapy Evaluation Patient Details Name: Austin Calderon MRN: [id number] DOB: Jul 05, 1956 Today's Date: 10/11/2014   History of Present Illness  59 y.o. s/p POSTERIOR LATERAL FUSION LUMBAR FOUR-FIVE.  Clinical Impression  Pt admitted with/for L45 PLA.  Pt currently limited functionally due to the problems listed below.  (see problems list.)  Pt will benefit from PT to maximize function and safety to be able to get home safely with available assist of family .     Follow Up Recommendations No PT follow up    Equipment Recommendations  None recommended by PT    Recommendations for Other Services       Precautions / Restrictions Precautions Precautions: Back;Fall Precaution Booklet Issued: No Precaution Comments: educated on back precautions Required Braces or Orthoses: Spinal Brace Spinal Brace: Lumbar corset;Applied in sitting position Restrictions Weight Bearing Restrictions: No      Mobility  Bed Mobility Overal bed mobility: Needs Assistance   Rolling: Supervision Sidelying to sit: Min assist       General bed mobility comments: reinforced the technique for side to/from sit and log roll, but didn't get to practice.  Transfers Overall transfer level: Needs assistance   Transfers: Sit to/from Stand Sit to Stand: Min guard            Ambulation/Gait Ambulation/Gait assistance: Supervision Ambulation Distance (Feet): 250 Feet Assistive device: None Gait Pattern/deviations: Step-through pattern Gait velocity: slower   General Gait Details: slow and guarded, but safe  Stairs Stairs: Yes Stairs assistance: Supervision Stair Management: One rail Right;Alternating pattern;Step to pattern;Forwards Number of Stairs: 12 General stair comments: safe with rail  Wheelchair Mobility    Modified Rankin (Stroke Patients Only)       Balance Overall balance assessment: No apparent balance deficits (not formally assessed)                                            Pertinent Vitals/Pain Pain Assessment: 0-10 Pain Score: 7  Pain Location: back at incision Pain Descriptors / Indicators: Grimacing Pain Intervention(s): Monitored during session    Home Living Family/patient expects to be discharged to:: Private residence Living Arrangements: Spouse/significant other Available Help at Discharge: Family;Available 24 hours/day Type of Home: House Home Access: Stairs to enter Entrance Stairs-Rails: None Entrance Stairs-Number of Steps: 2 Home Layout: One level Home Equipment: Walker - standard;Shower seat      Prior Function Level of Independence: Independent               Hand Dominance   Dominant Hand: Right    Extremity/Trunk Assessment   Upper Extremity Assessment: Overall WFL for tasks assessed           Lower Extremity Assessment: Defer to PT evaluation         Communication   Communication: No difficulties  Cognition Arousal/Alertness: Awake/alert Behavior During Therapy: WFL for tasks assessed/performed Overall Cognitive Status: Within Functional Limits for tasks assessed                      General Comments General comments (skin integrity, edema, etc.): reinforced back care/precautions, bed mobility, brace issues, lifting restrictions and progression of activity.    Exercises        Assessment/Plan    PT Assessment Patient needs continued PT services  PT Diagnosis Acute pain   PT Problem List Decreased activity tolerance;Decreased knowledge  of precautions;Decreased mobility;Pain  PT Treatment Interventions DME instruction;Gait training;Functional mobility training;Therapeutic activities;Patient/family education   PT Goals (Current goals can be found in the Care Plan section) Acute Rehab PT Goals Patient Stated Goal: not stated PT Goal Formulation: With patient Time For Goal Achievement: 10/13/14 Potential to Achieve Goals: Good    Frequency Min 1X/week    Barriers to discharge        Co-evaluation               End of Session Equipment Utilized During Treatment: Back brace Activity Tolerance: Patient tolerated treatment well Patient left: with call bell/phone within reach;with family/visitor present Nurse Communication: Mobility status         Time: 3825-0539 PT Time Calculation (min) (ACUTE ONLY): 15 min   Charges:   PT Evaluation $Initial PT Evaluation Tier I: 1 Procedure     PT G Codes:        Austin Calderon, Austin Calderon 10/11/2014, 5:07 PM  10/11/2014  Austin Calderon, PT [phone]  (pager)

## 2014-10-11 NOTE — H&P (Signed)
Austin Calderon is an 59 y.o. male.   Chief Complaint: Back and right leg pain HPI: 59 year old male status post previous L4-5 decompression and fusion with interbody Ray cages by another physician presents now with lumbar pseudoarthrosis and intractable back and right leg pain. Patient presents now for decompression on the right at L4-5 with augmentation of his fusion with posterior lateral bone grafting and pedicle screw fixation.  Past Medical History  Diagnosis Date  . Family history of adverse reaction to anesthesia     sister gets sick with anesthesia  . HTN (hypertension)     was on Norvasc but has been off for several months  . Weakness     both legs occasionally  . Chronic back pain   . GERD (gastroesophageal reflux disease)     hx of-was on meds yrs ago  . History of gastric ulcer   . Nocturia   . RBBB (right bundle branch block)     Past Surgical History  Procedure Laterality Date  . Back surgery    . Spinal fusion    . Knee arthroscopy Right   . Esophagogastroduodenoscopy      History reviewed. No pertinent family history. Social History:  reports that he has quit smoking. He does not have any smokeless tobacco history on file. He reports that he drinks alcohol. He reports that he does not use illicit drugs.  Allergies: No Known Allergies  Medications Prior to Admission  Medication Sig Dispense Refill  . cyclobenzaprine (FEXMID) 7.5 MG tablet Take 7.5 mg by mouth as needed for muscle spasms.    . diphenhydramine-acetaminophen (TYLENOL PM) 25-500 MG TABS Take 1 tablet by mouth at bedtime as needed (sleep/pain).    . methocarbamol (ROBAXIN) 500 MG tablet Take 1 tablet (500 mg total) by mouth 3 (three) times daily. 21 tablet 0  . HYDROcodone-acetaminophen (NORCO) 7.5-325 MG per tablet Take 1 tablet by mouth every 4 (four) hours as needed for pain. 20 tablet 0    No results found for this or any previous visit (from the past 48 hour(s)). No results found.  Review  of Systems  Constitutional: Negative.   HENT: Negative.   Respiratory: Negative.   Cardiovascular: Negative.   Gastrointestinal: Negative.   Genitourinary: Negative.   Musculoskeletal: Negative.   Skin: Negative.   Neurological: Negative.   Endo/Heme/Allergies: Negative.   Psychiatric/Behavioral: Negative.     Blood pressure 174/102, pulse 91, temperature 98.2 F (36.8 C), temperature source Oral, resp. rate 20, weight 102.967 kg (227 lb), SpO2 97 %. Physical Exam  Constitutional: He is oriented to person, place, and time. He appears well-developed and well-nourished. No distress.  HENT:  Head: Normocephalic and atraumatic.  Right Ear: External ear normal.  Left Ear: External ear normal.  Nose: Nose normal.  Mouth/Throat: Oropharynx is clear and moist. No oropharyngeal exudate.  Eyes: Conjunctivae and EOM are normal. Pupils are equal, round, and reactive to light. Right eye exhibits no discharge. Left eye exhibits no discharge.  Neck: Normal range of motion. Neck supple. No tracheal deviation present. No thyromegaly present.  Cardiovascular: Normal rate, regular rhythm, normal heart sounds and intact distal pulses.   No murmur heard. Respiratory: Effort normal and breath sounds normal. No respiratory distress. He has no wheezes.  GI: Soft. Bowel sounds are normal. He exhibits no distension. There is no tenderness.  Musculoskeletal: Normal range of motion. He exhibits no edema.  Neurological: He is alert and oriented to person, place, and time. He has normal  reflexes. He displays normal reflexes. No cranial nerve deficit. He exhibits normal muscle tone. Coordination normal.  Skin: Skin is warm and dry. No rash noted. He is not diaphoretic. No erythema. No pallor.  Psychiatric: He has a normal mood and affect. His behavior is normal. Judgment and thought content normal.     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 Plan right L4-5 was decompressive laminotomy  and foraminotomies with L4-5 posterior lateral arthrodesis utilizing nonsegmental pedicle screw instrumentation and locally harvested autograft. The patient is aware the risks and benefits. He wishes to proceed.  Leslieann Whisman A 10/11/2014, 7:39 AM

## 2014-10-11 NOTE — Op Note (Signed)
Date of procedure: 10/11/2014  Date of dictation: Same  Service: Neurosurgery  Preoperative diagnosis: L4-5 pseudoarthrosis with recurrent right L4-5 foraminal stenosis and right L4 radiculopathy.  Postoperative diagnosis: Same  Procedure Name: Reexploration of L4-5 fusion with a right L4-5 extraforaminal decompression of the right L4 nerve root.  L4-L5 posterior lateral arthrodesis utilizing local autograft, morselized allograft, bone morphogenic protein, and nonsegmental pedicle screw fixation  Surgeon:Darcia Lampi A.Johny Pitstick, M.D.  Asst. Surgeon: Saintclair Halsted  Anesthesia: General  Indication: 59 year old male status post previous L4-5 interbody fusion with Ray cages may years ago by another physician who presents with severe back and intractable right lower extremity pain. 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 Patient presents now for right L4 foraminotomy and posterior lateral fusion.  Operative note: After induction of anesthesia, patient positioned prone on the Wilson frame and appropriately padded. Lumbar region prepped and draped sterilely. Incision made overlying L4-L5. Subperiosteal dissection performed bilaterally exposing remnant of the lamina of L4 the transverse processes at L4 the superior facet of L5 and the transverse process of L5. Retractor placed. Fluoroscopy used. Levels confirmed. On the right at L4-5 approach was made into the extra foraminal space. Dissection was performed in the lateral aspect of the pars interarticularis was resected. The L4 nerve root was identified and dissected free. An aggressive foraminotomy was then performed on course exiting L4 nerve root and then the L4 nerve root was then tracked back into the spinal canal. All elements of the compression appeared to be relieved at this point. Pedicles of L4 and L5 were notified using surface landmarks and intraoperative fluoroscopy. Superficial bone was  removed overlying the pedicle of L4 and L5. Each pedicle was then probed using a pedicle awl. Each pedicle awl tract was probed and found to be solidly within the bone. Each pedicle tract was then tapped with a screw. 6.75 x 50 mm screws were placed bilaterally at L4. 6.75 x 45 mm screws placed bilaterally at L5. Stryker medical radius Brand screws were used. Transverse processes and residual facet joint were decorticated using a high-speed drill bilaterally. Morcellized autograft obtained during the facetectomy on the right side was then packed posterior laterally. Morselized autograft contained in a bag was then packed posterior laterally as well as a small section of bone morphogenic soaked sponge. Short segment titanium rods and placed of the screw heads at L4 and L5. Locking caps and placed of the screws. Locking caps were then engaged with the construct under compression. Final images revealed good position of bone grafts and hardware at the proper operative level with normal alignment of the spine. Wound is irrigated one final time. Hemostasis was achieved with the electrocautery. A medium Hemovac drain was left the epidural space. Vancomycin powder was placed in the deep wound space. Wounds and closed in layers with Vicryl sutures. Steri-Strips and sterile dressing were applied. No apparent complications. Patient tolerated the procedure well and he returns to the recovery room postoperatively

## 2014-10-11 NOTE — Transfer of Care (Signed)
Immediate Anesthesia Transfer of Care Note  Patient: Austin Calderon  Procedure(s) Performed: Procedure(s): POSTERIOR LATERAL FUSION LUMBAR FOUR-FIVE (N/A)  Patient Location: PACU  Anesthesia Type:General  Level of Consciousness: awake, alert  and oriented  Airway & Oxygen Therapy: Patient Spontanous Breathing and Patient connected to face mask oxygen  Post-op Assessment: Report given to RN, Post -op Vital signs reviewed and stable and Patient moving all extremities X 4  Post vital signs: Reviewed and stable  Last Vitals:  Filed Vitals:   10/11/14 0610  BP: 174/102  Pulse: 91  Temp: 36.8 C  Resp: 20    Complications: No apparent anesthesia complications

## 2014-10-11 NOTE — Plan of Care (Signed)
Problem: Consults Goal: Diagnosis - Spinal Surgery Outcome: Completed/Met Date Met:  10/11/14 Thoraco/Lumbar Spine Fusion

## 2014-10-11 NOTE — Anesthesia Procedure Notes (Signed)
Procedure Name: Intubation Date/Time: 10/11/2014 7:54 AM Performed by: Neldon Newport Pre-anesthesia Checklist: Patient being monitored, Suction available, Emergency Drugs available, Patient identified and Timeout performed Patient Re-evaluated:Patient Re-evaluated prior to inductionOxygen Delivery Method: Circle system utilized Preoxygenation: Pre-oxygenation with 100% oxygen Intubation Type: IV induction Ventilation: Mask ventilation without difficulty Laryngoscope Size: Mac and 4 Grade View: Grade I Tube type: Oral Tube size: 7.5 mm Number of attempts: 1 Placement Confirmation: positive ETCO2,  ETT inserted through vocal cords under direct vision and breath sounds checked- equal and bilateral Secured at: 23 cm Tube secured with: Tape Dental Injury: Teeth and Oropharynx as per pre-operative assessment

## 2014-10-11 NOTE — Anesthesia Preprocedure Evaluation (Addendum)
Anesthesia Evaluation  Patient identified by MRN, date of birth, ID band Patient awake    Reviewed: Allergy & Precautions, H&P , NPO status , Patient's Chart, lab work & pertinent test results  Airway Mallampati: III  TM Distance: >3 FB Neck ROM: Full    Dental  (+) Teeth Intact, Dental Advidsory Given, Poor Dentition   Pulmonary former smoker,          Cardiovascular hypertension, + dysrhythmias (RBBB)     Neuro/Psych  Neuromuscular disease negative neurological ROS     GI/Hepatic Neg liver ROS, GERD-  ,  Endo/Other  negative endocrine ROS  Renal/GU negative Renal ROS     Musculoskeletal   Abdominal   Peds  Hematology negative hematology ROS (+)   Anesthesia Other Findings Drinks alcohol on a daily basis. Narrow palate  Reproductive/Obstetrics                          Anesthesia Physical Anesthesia Plan  ASA: II  Anesthesia Plan: General   Post-op Pain Management:    Induction: Intravenous  Airway Management Planned: Oral ETT  Additional Equipment:   Intra-op Plan:   Post-operative Plan: Extubation in OR  Informed Consent: I have reviewed the patients History and Physical, chart, labs and discussed the procedure including the risks, benefits and alternatives for the proposed anesthesia with the patient or authorized representative who has indicated his/her understanding and acceptance.   Dental advisory given and Dental Advisory Given  Plan Discussed with: CRNA, Anesthesiologist and Surgeon  Anesthesia Plan Comments:        Anesthesia Quick Evaluation

## 2014-10-11 NOTE — Brief Op Note (Signed)
10/11/2014  9:59 AM  PATIENT:  Deborra Medina  59 y.o. male  PRE-OPERATIVE DIAGNOSIS:  Pseudoarthrosis  POST-OPERATIVE DIAGNOSIS:  Pseudoarthrosis  PROCEDURE:  Procedure(s): POSTERIOR LATERAL FUSION LUMBAR FOUR-FIVE (N/A)  SURGEON:  Surgeon(s) and Role:    * Charlie Pitter, MD - Primary    * Elaina Hoops, MD - Assisting  PHYSICIAN ASSISTANT:   ASSISTANTS:    ANESTHESIA:   general  EBL:  Total I/O In: 2250 [I.V.:2000; IV Piggyback:250] Out: 400 [Urine:150; Blood:250]  BLOOD ADMINISTERED:none  DRAINS: (Medium) Hemovact drain(s) in the Epidural space with  Suction Open   LOCAL MEDICATIONS USED:  MARCAINE     SPECIMEN:  No Specimen  DISPOSITION OF SPECIMEN:  N/A  COUNTS:  YES  TOURNIQUET:  * No tourniquets in log *  DICTATION: .Dragon Dictation  PLAN OF CARE: Admit to inpatient   PATIENT DISPOSITION:  PACU - hemodynamically stable.   Delay start of Pharmacological VTE agent (>24hrs) due to surgical blood loss or risk of bleeding: yes

## 2014-10-11 NOTE — Anesthesia Postprocedure Evaluation (Signed)
  Anesthesia Post-op Note  Patient: Austin Calderon  Procedure(s) Performed: Procedure(s): POSTERIOR LATERAL FUSION LUMBAR FOUR-FIVE (N/A)  Patient Location: PACU  Anesthesia Type:General  Level of Consciousness: awake and alert   Airway and Oxygen Therapy: Patient Spontanous Breathing  Post-op Pain: none  Post-op Assessment: Post-op Vital signs reviewed  Post-op Vital Signs: Reviewed  Last Vitals:  Filed Vitals:   10/11/14 1254  BP: 164/94  Pulse: 79  Temp: 36.4 C  Resp: 16    Complications: No apparent anesthesia complications

## 2014-10-11 NOTE — Evaluation (Addendum)
Occupational Therapy Evaluation Patient Details Name: Austin Calderon MRN: [id number] DOB: 1956/07/18 Today's Date: 10/11/2014    History of Present Illness 59 y.o. s/p POSTERIOR LATERAL FUSION LUMBAR FOUR-FIVE.   Clinical Impression   Pt s/p above. Pt independent with ADLs, PTA. Feel pt will benefit from acute OT to increase independence with BADLs, prior to d/c.     Follow Up Recommendations  No OT follow up;Supervision - Intermittent    Equipment Recommendations  3 in 1 bedside comode;Other (comment) (AE)    Recommendations for Other Services       Precautions / Restrictions Precautions Precautions: Back;Fall Precaution Booklet Issued: No Precaution Comments: educated on back precautions Required Braces or Orthoses: Spinal Brace Spinal Brace: Lumbar corset;Applied in sitting position Restrictions Weight Bearing Restrictions: No      Mobility Bed Mobility Overal bed mobility: Needs Assistance Bed Mobility: Rolling;Sidelying to Sit Rolling: Supervision Sidelying to sit: Min assist       General bed mobility comments: assist to boost trunk. Cues for log roll technique.  Transfers Overall transfer level: Needs assistance   Transfers: Sit to/from Stand Sit to Stand: Min guard              Balance   Used IV pole for ambulation.                                          ADL Overall ADL's : Needs assistance/impaired                 Upper Body Dressing : Set up;Supervision/safety;Sitting   Lower Body Dressing: Sit to/from stand;With adaptive equipment;Minimal assistance   Toilet Transfer: Min guard;Ambulation (bed)           Functional mobility during ADLs: Min guard General ADL Comments: Educated on use of cup for oral care and placement of grooming items to avoid breaking precautions. Educated on AE/cost/where to purchase and pt practiced with reacher/sockaid with donning/doffing sock. Educated on back brace. Educated on  safety such as safe shoewear, rugs/items on floor, pets, and recommended spouse be with him for shower transfer.      Vision                     Perception     Praxis      Pertinent Vitals/Pain Pain Assessment: 0-10 Pain Score: 6  Pain Location: back Pain Intervention(s): Monitored during session     Hand Dominance Right   Extremity/Trunk Assessment Upper Extremity Assessment Upper Extremity Assessment: Overall WFL for tasks assessed   Lower Extremity Assessment Lower Extremity Assessment: Defer to PT evaluation       Communication Communication Communication: No difficulties   Cognition Arousal/Alertness: Awake/alert Behavior During Therapy: WFL for tasks assessed/performed Overall Cognitive Status: Within Functional Limits for tasks assessed                     General Comments       Exercises       Shoulder Instructions      Home Living Family/patient expects to be discharged to:: Private residence Living Arrangements: Spouse/significant other Available Help at Discharge: Family;Available 24 hours/day Type of Home: House Home Access: Stairs to enter [employer] of Steps: 2 Entrance Stairs-Rails: None Home Layout: One level     Bathroom Shower/Tub: Walk-in Hydrologist: Standard     Home  Equipment: Walker - standard;Shower seat          Prior Functioning/Environment Level of Independence: Independent             OT Diagnosis: Acute pain   OT Problem List: Decreased strength;Decreased range of motion;Decreased knowledge of use of DME or AE;Decreased knowledge of precautions;Pain   OT Treatment/Interventions: Self-care/ADL training;DME and/or AE instruction;Therapeutic activities;Patient/family education;Balance training    OT Goals(Current goals can be found in the care plan section) Acute Rehab OT Goals Patient Stated Goal: not stated OT Goal Formulation: With patient/family Time For  Goal Achievement: 10/18/14 Potential to Achieve Goals: Good  OT Frequency: Min 2X/week   Barriers to D/C:            Co-evaluation              End of Session Equipment Utilized During Treatment: Gait belt;Back brace  Activity Tolerance: Patient tolerated treatment well Patient left: in bed;with family/visitor present;with nursing/sitter in room  Nurse communication: Recommending 3 in 1    Time: 1916-6060 OT Time Calculation (min): 19 min Charges:  OT General Charges $OT Visit: 1 Procedure OT Evaluation $Initial OT Evaluation Tier I: 1 Procedure G-CodesBenito Mccreedy OTR/L [id number] 10/11/2014, 4:36 PM

## 2014-10-12 MED ORDER — DIAZEPAM 5 MG PO TABS: 5.0000 mg | ORAL_TABLET | Freq: Four times a day (QID) | ORAL | Status: DC | PRN

## 2014-10-12 MED ORDER — OXYCODONE-ACETAMINOPHEN 10-325 MG PO TABS: 1.0000 | ORAL_TABLET | ORAL | Status: DC | PRN

## 2014-10-12 NOTE — Progress Notes (Signed)
Physical Therapy Treatment Patient Details Name: GUIDO COMP MRN: [id number] DOB: [birth year] Today's Date: 10/12/2014    History of Present Illness 59 y.o. s/p POSTERIOR LATERAL FUSION LUMBAR FOUR-FIVE.    PT Comments    All education completed and reinforced.  Emphasis on safe bed mobility.  Pt demonstrated understanding.  No further PT needs.  Will sign off.  Follow Up Recommendations  No PT follow up     Equipment Recommendations  None recommended by PT    Recommendations for Other Services       Precautions / Restrictions Precautions Precautions: Back Precaution Comments: educated on back precautions Required Braces or Orthoses: Spinal Brace Spinal Brace: Lumbar corset;Applied in sitting position    Mobility  Bed Mobility Overal bed mobility: Needs Assistance Bed Mobility: Rolling;Sidelying to Sit;Sit to Sidelying Rolling: Supervision Sidelying to sit: Supervision     Sit to sidelying: Supervision General bed mobility comments: reinforced the technique for side to/from sit and log roll, but didn't get to practice.  Transfers Overall transfer level: Needs assistance Equipment used: None Transfers: Sit to/from Stand Sit to Stand: Independent            Ambulation/Gait Ambulation/Gait assistance: Independent Ambulation Distance (Feet): 250 Feet   Gait Pattern/deviations: Step-through pattern Gait velocity: slower   General Gait Details: Still guarded, but safe   Stairs            Wheelchair Mobility    Modified Rankin (Stroke Patients Only)       Balance Overall balance assessment: No apparent balance deficits (not formally assessed)                                  Cognition Arousal/Alertness: Awake/alert Behavior During Therapy: WFL for tasks assessed/performed Overall Cognitive Status: Within Functional Limits for tasks assessed                      Exercises      General Comments General comments  (skin integrity, edema, etc.): reinforced back care/precautions, bed mobility, brace issues, lifting restrictions and progression of activity.      Pertinent Vitals/Pain Pain Assessment: Faces Faces Pain Scale: Hurts little more Pain Location: back at incision mostly Pain Descriptors / Indicators: Aching Pain Intervention(s): Monitored during session    Home Living                      Prior Function            PT Goals (current goals can now be found in the care plan section) Acute Rehab PT Goals Patient Stated Goal: not stated PT Goal Formulation: With patient Time For Goal Achievement: 10/13/14 Potential to Achieve Goals: Good Progress towards PT goals: Progressing toward goals    Frequency       PT Plan Current plan remains appropriate    Co-evaluation             End of Session Equipment Utilized During Treatment: Back brace Activity Tolerance: Patient tolerated treatment well Patient left: with call bell/phone within reach;with family/visitor present     Time: 0945-1000 PT Time Calculation (min) (ACUTE ONLY): 15 min  Charges:  $Therapeutic Activity: 8-22 mins                    G Codes:      Alston Berrie, [name] 10/12/2014, 10:11 AM 10/12/2014  Donnella Sham, PT  [phone]  (pager)

## 2014-10-12 NOTE — Discharge Instructions (Signed)

## 2014-10-12 NOTE — Progress Notes (Signed)
Patient alert and oriented, mae's well, voiding adequate amount of urine, swallowing without difficulty,  c/o mild pain. Patient discharged home with family. Script and discharged instructions given to patient. Patient and family stated understanding of d/c instructions given and has an appointment with MD. Tomma Rakers RN

## 2014-10-12 NOTE — Discharge Summary (Signed)
Physician Discharge Summary  Patient ID: Austin Calderon MRN: [id number] DOB/AGE: 02/15/1956 59 y.o.  Admit date: 10/11/2014 Discharge date: 10/12/2014  Admission Diagnoses:  Discharge Diagnoses:  Principal Problem:   Lumbar pseudoarthrosis   Discharged Condition: good  Hospital Course: Patient admitted to the hospital where he underwent uncompensated lumbar decompression and fusion. Postoperative use doing very well. Back and leg pain much improved. Standing and walking without difficulty. Ready for discharge home.  Consults:   Significant Diagnostic Studies:   Treatments:   Discharge Exam: Blood pressure 131/89, pulse 90, temperature 97.8 F (36.6 C), temperature source Oral, resp. rate 18, weight 102.967 kg (227 lb), SpO2 97 %. Awake and alert. Oriented and appropriate. Motor and sensory function intact. Wound clean and dry. Chest abdomen benign. Disposition: 01-Home or Self Care     Medication List    TAKE these medications        cyclobenzaprine 7.5 MG tablet  Commonly known as:  FEXMID  Take 7.5 mg by mouth as needed for muscle spasms.     diazepam 5 MG tablet  Commonly known as:  VALIUM  Take 1-2 tablets (5-10 mg total) by mouth every 6 (six) hours as needed for muscle spasms.     diphenhydramine-acetaminophen 25-500 MG Tabs  Commonly known as:  TYLENOL PM  Take 1 tablet by mouth at bedtime as needed (sleep/pain).     HYDROcodone-acetaminophen 7.5-325 MG per tablet  Commonly known as:  NORCO  Take 1 tablet by mouth every 4 (four) hours as needed for pain.     methocarbamol 500 MG tablet  Commonly known as:  ROBAXIN  Take 1 tablet (500 mg total) by mouth 3 (three) times daily.     oxyCODONE-acetaminophen 10-325 MG per tablet  Commonly known as:  PERCOCET  Take 1-2 tablets by mouth every 4 (four) hours as needed for pain.           Follow-up Information    Follow up with Charlie Pitter, MD.   Specialty:  Neurosurgery   Contact information:   [address] [place] [address] [place] [address] [phone]       Signed: Charlie Pitter 10/12/2014, 9:55 AM

## 2014-10-12 NOTE — Progress Notes (Addendum)
Occupational Therapy Treatment Patient Details Name: Austin Calderon MRN: [id number] DOB: 1956-01-25 Today's Date: 10/12/2014    History of present illness 59 y.o. s/p POSTERIOR LATERAL FUSION LUMBAR FOUR-FIVE.   OT comments  Education provided and feel pt is safe to d/c home with family available to assist.  Follow Up Recommendations  No OT follow up;Supervision - Intermittent    Equipment Recommendations  3 in 1 bedside comode    Recommendations for Other Services      Precautions / Restrictions Precautions Precautions: Back Precaution Booklet Issued: No Precaution Comments: pt able to state 3/3 back precautions Required Braces or Orthoses: Spinal Brace Spinal Brace: Lumbar corset;Applied in sitting position Restrictions Weight Bearing Restrictions: No       Mobility Bed Mobility Overal bed mobility: Needs Assistance Bed Mobility: Rolling;Sidelying to Sit;Sit to Sidelying Rolling: Supervision (cues to use mattress and to roll all the way over on back) Sidelying to sit: Supervision/Modified Independent     Sit to sidelying: Modified independent (Device/Increase time) General bed mobility comments: reinforced technique.  Transfers Overall transfer level: Needs assistance Equipment used: None Transfers: Sit to/from Stand Sit to Stand: Supervision              Balance Overall balance assessment: No apparent balance deficits (not formally assessed)                                 ADL Overall ADL's : Needs assistance/impaired     Grooming: Oral care;Set up;Supervision/safety;Standing           Upper Body Dressing : Set up;Sitting;Supervision/safety   Lower Body Dressing: Supervision/safety;Sitting/lateral leans;Set up (sock)   Toilet Transfer: Ambulation;Supervision/safety;Modified Independent (bed; Supervision for sit to stand)   Toileting- Clothing Manipulation and Hygiene: Moderate assistance;Sit to/from stand       Functional  mobility during ADLs: Modified independent General ADL Comments: Reviewed safety tips such as safe shoewear, rugs. recommended spouse be with him for shower transfer. Educated/reviewed on AE. Explained what pt could use for toilet aide as he felt he could not fully reach behind without twisting. Reviewed/educated on back brace information as well as use of cup for oral care and placement of grooming items.  educated on positoning of pillows. Discussed incorporating precautions into functional activities.  Cues for precautions in session.      Vision                     Perception     Praxis      Cognition  Awake/Alert Behavior During Therapy: WFL for tasks assessed/performed Overall Cognitive Status: Within Functional Limits for tasks assessed                       Extremity/Trunk Assessment               Exercises     Shoulder Instructions       General Comments      Pertinent Vitals/ Pain       Pain Assessment: 0-10 Pain Score: 4  Faces Pain Scale: Hurts little more Pain Location: back Pain Descriptors / Indicators:  ("hurts") Pain Intervention(s): Monitored during session  Home Living  Prior Functioning/Environment              Frequency Min 2X/week     Progress Toward Goals  OT Goals(current goals can now be found in the care plan section)  Progress towards OT goals: Progressing toward goals  Acute Rehab OT Goals Patient Stated Goal: not stated OT Goal Formulation: With patient/family Time For Goal Achievement: 10/18/14 Potential to Achieve Goals: Good ADL Goals Pt Will Perform Grooming: with modified independence;standing Pt Will Perform Lower Body Dressing: with modified independence;with adaptive equipment;sit to/from stand Pt Will Transfer to Toilet: with modified independence;ambulating Pt Will Perform Toileting - Clothing Manipulation and hygiene: with modified  independence;sit to/from stand Additional ADL Goal #1: Pt will independently verbalize and demonstrate 3/3 back precautions.  Plan Discharge plan remains appropriate    Co-evaluation                 End of Session Equipment Utilized During Treatment: Gait belt;Back brace   Activity Tolerance Patient tolerated treatment well   Patient Left in bed   Nurse Communication          Time: 5686-1683 OT Time Calculation (min): 17 min  Charges: OT General Charges $OT Visit: 1 Procedure OT Treatments $Self Care/Home Management : 8-22 mins  Benito Mccreedy OTR/L [phone] 10/12/2014, 10:28 AM

## 2014-10-14 ENCOUNTER — Emergency Department (HOSPITAL_COMMUNITY): Payer: BLUE CROSS/BLUE SHIELD

## 2014-10-14 ENCOUNTER — Inpatient Hospital Stay (HOSPITAL_COMMUNITY)
Admission: EM | Admit: 2014-10-14 | Discharge: 2014-10-16 | DRG: 871 | Disposition: A | Discharge status: Home / Self Care | Payer: BLUE CROSS/BLUE SHIELD | Attending: Internal Medicine | Admitting: Internal Medicine

## 2014-10-14 ENCOUNTER — Encounter (HOSPITAL_COMMUNITY): Payer: Self-pay | Admitting: Emergency Medicine

## 2014-10-14 DIAGNOSIS — R404 Transient alteration of awareness: Secondary | ICD-10-CM

## 2014-10-14 DIAGNOSIS — R4182 Altered mental status, unspecified: Secondary | ICD-10-CM | POA: Diagnosis present

## 2014-10-14 DIAGNOSIS — I10 Essential (primary) hypertension: Secondary | ICD-10-CM | POA: Diagnosis present

## 2014-10-14 DIAGNOSIS — R509 Fever, unspecified: Secondary | ICD-10-CM | POA: Diagnosis not present

## 2014-10-14 DIAGNOSIS — G8929 Other chronic pain: Secondary | ICD-10-CM | POA: Diagnosis present

## 2014-10-14 DIAGNOSIS — Z87891 Personal history of nicotine dependence: Secondary | ICD-10-CM

## 2014-10-14 DIAGNOSIS — E860 Dehydration: Secondary | ICD-10-CM | POA: Diagnosis present

## 2014-10-14 DIAGNOSIS — Z981 Arthrodesis status: Secondary | ICD-10-CM

## 2014-10-14 DIAGNOSIS — K219 Gastro-esophageal reflux disease without esophagitis: Secondary | ICD-10-CM | POA: Diagnosis present

## 2014-10-14 DIAGNOSIS — A419 Sepsis, unspecified organism: Principal | ICD-10-CM | POA: Diagnosis present

## 2014-10-14 DIAGNOSIS — G9340 Encephalopathy, unspecified: Secondary | ICD-10-CM | POA: Diagnosis present

## 2014-10-14 DIAGNOSIS — F1010 Alcohol abuse, uncomplicated: Secondary | ICD-10-CM | POA: Diagnosis present

## 2014-10-14 DIAGNOSIS — R911 Solitary pulmonary nodule: Secondary | ICD-10-CM | POA: Diagnosis present

## 2014-10-14 DIAGNOSIS — Z79899 Other long term (current) drug therapy: Secondary | ICD-10-CM

## 2014-10-14 DIAGNOSIS — R6510 Systemic inflammatory response syndrome (SIRS) of non-infectious origin without acute organ dysfunction: Secondary | ICD-10-CM

## 2014-10-14 DIAGNOSIS — I4510 Unspecified right bundle-branch block: Secondary | ICD-10-CM | POA: Diagnosis present

## 2014-10-14 DIAGNOSIS — M549 Dorsalgia, unspecified: Secondary | ICD-10-CM | POA: Diagnosis present

## 2014-10-14 LAB — COMPREHENSIVE METABOLIC PANEL
ALT: 14 U/L (ref 0–53)
AST: 18 U/L (ref 0–37)
Albumin: 3.7 g/dL (ref 3.5–5.2)
Alkaline Phosphatase: 88 U/L (ref 39–117)
Anion gap: 7 (ref 5–15)
BILIRUBIN TOTAL: 0.8 mg/dL (ref 0.3–1.2)
BUN: 7 mg/dL (ref 6–23)
CO2: 32 mmol/L (ref 19–32)
Calcium: 9.4 mg/dL (ref 8.4–10.5)
Chloride: 98 mmol/L (ref 96–112)
Creatinine, Ser: 1.15 mg/dL (ref 0.50–1.35)
GFR, EST AFRICAN AMERICAN: 79 mL/min — AB (ref >90)
GFR, EST NON AFRICAN AMERICAN: 68 mL/min — AB (ref >90)
Glucose, Bld: 127 mg/dL — ABNORMAL HIGH (ref 70–99)
POTASSIUM: 4.3 mmol/L (ref 3.5–5.1)
SODIUM: 137 mmol/L (ref 135–145)
Total Protein: 7.3 g/dL (ref 6.0–8.3)

## 2014-10-14 LAB — CBC WITH DIFFERENTIAL/PLATELET
Basophils Absolute: 0 10*3/uL (ref 0.0–0.1)
Basophils Relative: 0 % (ref 0–1)
Eosinophils Absolute: 0.4 10*3/uL (ref 0.0–0.7)
Eosinophils Relative: 3 % (ref 0–5)
HCT: 40 % (ref 39.0–52.0)
HEMOGLOBIN: 12.8 g/dL — AB (ref 13.0–17.0)
LYMPHS ABS: 1.3 10*3/uL (ref 0.7–4.0)
LYMPHS PCT: 12 % (ref 12–46)
MCH: 32 pg (ref 26.0–34.0)
MCHC: 32 g/dL (ref 30.0–36.0)
MCV: 100 fL (ref 78.0–100.0)
MONO ABS: 0.9 10*3/uL (ref 0.1–1.0)
Monocytes Relative: 8 % (ref 3–12)
Neutro Abs: 8.5 10*3/uL — ABNORMAL HIGH (ref 1.7–7.7)
Neutrophils Relative %: 77 % (ref 43–77)
PLATELETS: 236 10*3/uL (ref 150–400)
RBC: 4 MIL/uL — ABNORMAL LOW (ref 4.22–5.81)
RDW: 12.2 % (ref 11.5–15.5)
WBC: 11.1 10*3/uL — ABNORMAL HIGH (ref 4.0–10.5)

## 2014-10-14 LAB — URINALYSIS, ROUTINE W REFLEX MICROSCOPIC
BILIRUBIN URINE: NEGATIVE
Glucose, UA: NEGATIVE mg/dL
HGB URINE DIPSTICK: NEGATIVE
Ketones, ur: NEGATIVE mg/dL
LEUKOCYTES UA: NEGATIVE
Nitrite: NEGATIVE
PH: 8 (ref 5.0–8.0)
Protein, ur: NEGATIVE mg/dL
Specific Gravity, Urine: 1.013 (ref 1.005–1.030)
UROBILINOGEN UA: 0.2 mg/dL (ref 0.0–1.0)

## 2014-10-14 LAB — I-STAT TROPONIN, ED: Troponin i, poc: 0 ng/mL (ref 0.00–0.08)

## 2014-10-14 LAB — I-STAT CG4 LACTIC ACID, ED
LACTIC ACID, VENOUS: 0.66 mmol/L (ref 0.5–2.0)
Lactic Acid, Venous: 1.25 mmol/L (ref 0.5–2.0)

## 2014-10-14 LAB — PROCALCITONIN: PROCALCITONIN: 0.24 ng/mL

## 2014-10-14 MED ORDER — IOHEXOL 350 MG/ML SOLN
100.0000 mL | Freq: Once | INTRAVENOUS | Status: AC | PRN
  Administered 2014-10-14: 100 mL via INTRAVENOUS

## 2014-10-14 MED ORDER — OSELTAMIVIR PHOSPHATE 75 MG PO CAPS
75.0000 mg | ORAL_CAPSULE | Freq: Once | ORAL | Status: DC
  Filled 2014-10-14: qty 1

## 2014-10-14 MED ORDER — OXYCODONE-ACETAMINOPHEN 5-325 MG PO TABS
2.0000 | ORAL_TABLET | Freq: Once | ORAL | Status: AC
  Administered 2014-10-14: 2 via ORAL
  Filled 2014-10-14: qty 2

## 2014-10-14 MED ORDER — ONDANSETRON 4 MG PO TBDP
8.0000 mg | ORAL_TABLET | Freq: Once | ORAL | Status: AC
  Administered 2014-10-14: 8 mg via ORAL
  Filled 2014-10-14: qty 2

## 2014-10-14 MED ORDER — SODIUM CHLORIDE 0.9 % IV BOLUS (SEPSIS): 500.0000 mL | INTRAVENOUS | Status: DC

## 2014-10-14 MED ORDER — VANCOMYCIN HCL IN DEXTROSE 750-5 MG/150ML-% IV SOLN: 750.0000 mg | Freq: Two times a day (BID) | INTRAVENOUS | Status: DC

## 2014-10-14 MED ORDER — HALOPERIDOL LACTATE 5 MG/ML IJ SOLN: 2.0000 mg | Freq: Once | INTRAMUSCULAR | Status: DC

## 2014-10-14 MED ORDER — PIPERACILLIN-TAZOBACTAM 3.375 G IVPB 30 MIN
3.3750 g | Freq: Once | INTRAVENOUS | Status: AC
  Administered 2014-10-14: 3.375 g via INTRAVENOUS
  Filled 2014-10-14: qty 50

## 2014-10-14 MED ORDER — ACETAMINOPHEN 325 MG PO TABS
650.0000 mg | ORAL_TABLET | Freq: Once | ORAL | Status: AC
  Administered 2014-10-14: 650 mg via ORAL
  Filled 2014-10-14: qty 2

## 2014-10-14 MED ORDER — VANCOMYCIN HCL 10 G IV SOLR
1750.0000 mg | Freq: Once | INTRAVENOUS | Status: AC
  Administered 2014-10-14: 1750 mg via INTRAVENOUS
  Filled 2014-10-14: qty 1750

## 2014-10-14 MED ORDER — SODIUM CHLORIDE 0.9 % IV BOLUS (SEPSIS)
1000.0000 mL | INTRAVENOUS | Status: AC
  Administered 2014-10-14 (×3): 1000 mL via INTRAVENOUS

## 2014-10-14 MED ORDER — PIPERACILLIN-TAZOBACTAM 3.375 G IVPB
3.3750 g | Freq: Three times a day (TID) | INTRAVENOUS | Status: DC
  Filled 2014-10-14: qty 50

## 2014-10-14 NOTE — ED Notes (Signed)
Patient transported to CT 

## 2014-10-14 NOTE — H&P (Signed)
[facility] History and Physical  Austin Calderon GBT:[id number] DOB: 12/06/1955 DOA: 10/14/2014  Referring physician: Margarita Mail, PA PCP: Glo Herring., MD   Chief Complaint: Weakness  HPI: Austin Calderon is a 59 y.o. male is brought in by his family. The wife states he had back surgery on Monday.l Patients wife states thaat she has noted him to be weak. Patient states that he was feeling cold and also was having shakes. The wife states that he did not feel like eating,. He had a low grade temperature of 32F. He has no cough but has some congestion. He has been wheezing. This is all according to his family. He does not smoke but he does drink daily about 4-5 cans of beer. His last drink was on Sunday. He has no abdominal pain no headache. His family states that he has been dizzy also. His wife also thinks he has been taking too much pain medicine   Review of Systems:  Constitutional:  No weight loss, night sweats, ++Fevers, ++chills, ++fatigue.  HEENT:  No headaches, Difficulty swallowing Cardio-vascular:  No chest pain, Orthopnea, PND, swelling in lower extremities, anasarca, ++dizziness GI:  No heartburn, indigestion, abdominal pain, nausea, vomiting, diarrhea Resp:  No shortness of breath with exertion or at rest. No excess mucus, no productive cough, No non-productive cough, No coughing up of blood Skin:  no rash or lesions.  GU:  no dysuria, change in color of urine, no urgency or frequency Musculoskeletal:  No joint pain or swelling Psych:  Per family has been confused  Past Medical History  Diagnosis Date  . Family history of adverse reaction to anesthesia     sister gets sick with anesthesia  . HTN (hypertension)     was on Norvasc but has been off for several months  . Weakness     both legs occasionally  . Chronic back pain   . GERD (gastroesophageal reflux disease)     hx of-was on meds yrs ago  . History of gastric ulcer   . Nocturia   . RBBB  (right bundle branch block)    Past Surgical History  Procedure Laterality Date  . Back surgery    . Spinal fusion    . Knee arthroscopy Right   . Esophagogastroduodenoscopy    . Lumbar fusion     Social History:  reports that he has quit smoking. He does not have any smokeless tobacco history on file. He reports that he drinks alcohol. He reports that he does not use illicit drugs.  No Known Allergies  No family history on file.   Prior to Admission medications   Medication Sig Start Date End Date Taking? Authorizing Provider  cyclobenzaprine (FEXMID) 7.5 MG tablet Take 7.5 mg by mouth as needed for muscle spasms.   Yes Historical Provider, MD  diazepam (VALIUM) 5 MG tablet Take 1-2 tablets (5-10 mg total) by mouth every 6 (six) hours as needed for muscle spasms. 10/12/14  Yes Charlie Pitter, MD  diphenhydramine-acetaminophen (TYLENOL PM) 25-500 MG TABS Take 1 tablet by mouth at bedtime as needed (sleep/pain).   Yes Historical Provider, MD  HYDROcodone-acetaminophen (NORCO) 7.5-325 MG per tablet Take 1 tablet by mouth every 4 (four) hours as needed for pain. 05/16/13  Yes Lenox Ahr, PA-C  oxyCODONE-acetaminophen (PERCOCET) 10-325 MG per tablet Take 1-2 tablets by mouth every 4 (four) hours as needed for pain. 10/12/14  Yes Charlie Pitter, MD  methocarbamol (ROBAXIN) 500 MG tablet Take 1  tablet (500 mg total) by mouth 3 (three) times daily. Patient not taking: Reported on 10/14/2014 05/16/13   Lenox Ahr, PA-C   Physical Exam: Filed Vitals:   10/14/14 2300 10/14/14 2315 10/14/14 2330 10/14/14 2345  BP: 141/82 145/77 133/82 137/81  Pulse: 105 101 102 106  Temp:      TempSrc:      Resp: 16 16 19 20   Height:      Weight:      SpO2: 94% 96% 94% 96%    Wt Readings from Last 3 Encounters:  10/14/14 102.967 kg (227 lb)  10/11/14 102.967 kg (227 lb)  06/04/13 96.163 kg (212 lb)    General:  Appears calm and comfortable Eyes: PERRL, normal lids, irises & conjunctiva ENT: grossly  normal hearing, lips & tongue Neck: no LAD, masses or thyromegaly Cardiovascular: RRR, no m/r/g. No LE edema. Respiratory: CTA bilaterally, no w/r/r. Normal respiratory effort. Abdomen: soft, ntnd Skin: no rash or induration seen on limited exam Musculoskeletal: grossly normal tone BUE/BLE Psychiatric: grossly normal mood and affect Neurologic: grossly non-focal.          Labs on Admission:  Basic Metabolic Panel:  Recent Labs Lab 10/14/14 1957  NA 137  K 4.3  CL 98  CO2 32  GLUCOSE 127*  BUN 7  CREATININE 1.15  CALCIUM 9.4   Liver Function Tests:  Recent Labs Lab 10/14/14 1957  AST 18  ALT 14  ALKPHOS 88  BILITOT 0.8  PROT 7.3  ALBUMIN 3.7   No results for input(s): LIPASE, AMYLASE in the last 168 hours. No results for input(s): AMMONIA in the last 168 hours. CBC:  Recent Labs Lab 10/14/14 1957  WBC 11.1*  NEUTROABS 8.5*  HGB 12.8*  HCT 40.0  MCV 100.0  PLT 236   Cardiac Enzymes: No results for input(s): CKTOTAL, CKMB, CKMBINDEX, TROPONINI in the last 168 hours.  BNP (last 3 results) No results for input(s): BNP in the last 8760 hours.  ProBNP (last 3 results) No results for input(s): PROBNP in the last 8760 hours.  CBG: No results for input(s): GLUCAP in the last 168 hours.  Radiological Exams on Admission: Ct Angio Chest Pe W/cm &/or Wo Cm  10/14/2014   CLINICAL DATA:  Back surgery on 10/11/2014, hypoxic, evaluate for PE  EXAM: CT ANGIOGRAPHY CHEST WITH CONTRAST  TECHNIQUE: Multidetector CT imaging of the chest was performed using the standard protocol during bolus administration of intravenous contrast. Multiplanar CT image reconstructions and MIPs were obtained to evaluate the vascular anatomy.  CONTRAST:  152mL OMNIPAQUE IOHEXOL 350 MG/ML SOLN  COMPARISON:  Chest radiograph dated 10/14/2014. CT chest dated 05/16/2013.  FINDINGS: Suboptimal contrast opacifies fixation of the pulmonary artery due to bolus timing, with preferential opacification of  the aorta. Additionally, there is suboptimal opacification of the segmental/subsegmental pulmonary arteries due to respiratory motion. However, the study remains diagnostic at least to the segmental level.  No evidence of pulmonary embolism.  Mediastinum/Nodes: Heart is normal in size. No pericardial effusion.  Coronary atherosclerosis in the LAD.  Atherosclerotic calcifications of the aortic arch.  No suspicious mediastinal, hilar, or axillary lymphadenopathy.  Visualized thyroid is unremarkable.  Lungs/Pleura: Evaluation of lung parenchyma is constrained by respiratory motion.  Stable 6 x 4 mm nodule in the anterior right middle lobe. Nodular scarring in the lingula. Right apical pleural parenchymal scarring.  Mild paraseptal emphysematous changes.  Stable noncalcified pleural plaques overlying the right middle lobe (series 401/image 73) and left upper lobe (series  401/image 49). No pleural effusion or pneumothorax.  Upper abdomen: Visualized upper abdomen is within normal limits.  Musculoskeletal: Degenerative changes of the mid/lower thoracic spine. Old/healed right lateral 3rd rib fracture (series 401/ image 37).  Review of the MIP images confirms the above findings.  IMPRESSION: No evidence of pulmonary embolism.  Stable 6 x 4 mm nodule in the anterior right middle lobe, unchanged since 05/16/2013, likely benign. Given high risk for primary bronchogenic neoplasm, consider follow-up CT approximately in 12 months to confirm 2 year stability.  No evidence of acute cardiopulmonary disease.  This recommendation follows the consensus statement: Guidelines for Management of Small Pulmonary Nodules Detected on CT Scans: A Statement from the [employer] as published in Radiology 2005; 237:395-400.   Electronically Signed   By: Julian Hy M.D.   On: 10/14/2014 23:07   Dg Chest Port 1 View  10/14/2014   CLINICAL DATA:  Shortness of breath. Post recent back surgery. Difficulty urinating.  EXAM: PORTABLE  CHEST - 1 VIEW  COMPARISON:  Chest radiograph 01/11/2014  FINDINGS: The cardiomediastinal contours are unchanged allowing for differences in technique. Pulmonary vasculature is normal. There is no consolidation, pleural effusion, or pneumothorax. Prominent interstitial markings are stable from prior exam. No acute osseous abnormalities are seen.  IMPRESSION: No acute pulmonary process.   Electronically Signed   By: Jeb Levering M.D.   On: 10/14/2014 21:18      Assessment/Plan Active Problems:   HTN (hypertension)   Altered mental status   Fever   1. Fever -unclear etiology urine is clear and CXR is clear -will get cultures -will start on antibiotics -he is hemodynamically stable at this time -started on IV fluids also for hydration  2. Altered Mental Status -possibly drug related including valium and narcotics -will hold valium for now -also possibly related to ETOH use -will monitor for DTs  3. H/o ETOH abuse continuous -will monitor for signs of withdrawal for now  4. HTN -currently controlled -will monitor pressures  5. S/p Back Surgery -ED spoke with neurosurgery -they will see patient in the morning    Code Status: Full Code (must indicate code status--if unknown or must be presumed, indicate so) DVT Prophylaxis:Heparin Family Communication: Wife (indicate person spoken with, if applicable, with phone number if by telephone) Disposition Plan: Home (indicate anticipated LOS)  Time spent: 61min  KHAN,SAADAT A [facility] Pager [phone]

## 2014-10-14 NOTE — ED Provider Notes (Signed)
CSN: [id number]     Arrival date & time 10/14/14  1939 History   First MD Initiated Contact with Patient 10/14/14 2005     Chief Complaint  Patient presents with  . Post-op Problem     (Consider location/radiation/quality/duration/timing/severity/associated sxs/prior Treatment) HPI   This is a 59 year old male brought in by family for postop fever and confusion. The patient is status post lumbar fusion surgery 4 days ago by Dr. Trenton Gammon. History is given by the patient's wife. The patient was discharged 2 days ago. Yesterday he had significant pain and had difficulty controlling it with his Percocet at home. His wife states that yesterday he did develop a cough and had some gurgling sounds was complaining of some chest tightness and shortness of breath. He states that today he developed a fever and has been intermittently confused throughout the day. His pain has been localized in his back. He is also developed a rash on his face and is complaining of itching at the site of his infection. Patient denies any headaches, changes in vision. He is not on any antibiotics currently.  Past Medical History  Diagnosis Date  . Family history of adverse reaction to anesthesia     sister gets sick with anesthesia  . HTN (hypertension)     was on Norvasc but has been off for several months  . Weakness     both legs occasionally  . Chronic back pain   . GERD (gastroesophageal reflux disease)     hx of-was on meds yrs ago  . History of gastric ulcer   . Nocturia   . RBBB (right bundle branch block)    Past Surgical History  Procedure Laterality Date  . Back surgery    . Spinal fusion    . Knee arthroscopy Right   . Esophagogastroduodenoscopy    . Lumbar fusion     No family history on file. History  Substance Use Topics  . Smoking status: Former Research scientist (life sciences)  . Smokeless tobacco: Not on file     Comment: quit smoking in Mar 2015  . Alcohol Use: Yes     Comment: 4-5 beers daily    Review of  Systems   Ten systems reviewed and are negative for acute change, except as noted in the HPI.   Allergies  Review of patient's allergies indicates no known allergies.  Home Medications   Prior to Admission medications   Medication Sig Start Date End Date Taking? Authorizing Provider  cyclobenzaprine (FEXMID) 7.5 MG tablet Take 7.5 mg by mouth as needed for muscle spasms.    Historical Provider, MD  diazepam (VALIUM) 5 MG tablet Take 1-2 tablets (5-10 mg total) by mouth every 6 (six) hours as needed for muscle spasms. 10/12/14   Charlie Pitter, MD  diphenhydramine-acetaminophen (TYLENOL PM) 25-500 MG TABS Take 1 tablet by mouth at bedtime as needed (sleep/pain).    Historical Provider, MD  HYDROcodone-acetaminophen (NORCO) 7.5-325 MG per tablet Take 1 tablet by mouth every 4 (four) hours as needed for pain. 05/16/13   Lenox Ahr, PA-C  methocarbamol (ROBAXIN) 500 MG tablet Take 1 tablet (500 mg total) by mouth 3 (three) times daily. 05/16/13   Lenox Ahr, PA-C  oxyCODONE-acetaminophen (PERCOCET) 10-325 MG per tablet Take 1-2 tablets by mouth every 4 (four) hours as needed for pain. 10/12/14   Charlie Pitter, MD   BP 167/95 mmHg  Pulse 123  Temp(Src) 101.5 F (38.6 C) (Oral)  Resp 26  Ht 6'  1" (1.854 m)  Wt 227 lb (102.967 kg)  BMI 29.96 kg/m2  SpO2 93% Physical Exam  Constitutional: He is oriented to person, place, and time. He appears well-developed and well-nourished. No distress.  Head, complexion with blotchy patches on his face.  HENT:  Head: Normocephalic and atraumatic.  Eyes: Conjunctivae and EOM are normal. Pupils are equal, round, and reactive to light. No scleral icterus.  Myotic pupils  Neck: Normal range of motion. Neck supple. No JVD present.  Cardiovascular: Normal rate, regular rhythm and normal heart sounds.   Pulmonary/Chest: Effort normal. No respiratory distress. He has wheezes. He has no rales.  Abdominal: Soft. He exhibits no distension. There is no  tenderness.  Musculoskeletal: He exhibits no edema.  Neurological: He is alert and oriented to person, place, and time.  Skin: Skin is warm and dry. He is not diaphoretic.  The patient is tender to palpation along the wound. However, there is no sign of infection.  Psychiatric: His behavior is normal.  Nursing note and vitals reviewed.   ED Course  Procedures (including critical care time) Labs Review Labs Reviewed  CBC WITH DIFFERENTIAL/PLATELET - Abnormal; Notable for the following:    WBC 11.1 (*)    RBC 4.00 (*)    Hemoglobin 12.8 (*)    Neutro Abs 8.5 (*)    All other components within normal limits  COMPREHENSIVE METABOLIC PANEL - Abnormal; Notable for the following:    Glucose, Bld 127 (*)    GFR calc non Af Amer 68 (*)    GFR calc Af Amer 79 (*)    All other components within normal limits  CULTURE, BLOOD (ROUTINE X 2)  CULTURE, BLOOD (ROUTINE X 2)  URINE CULTURE  URINALYSIS, ROUTINE W REFLEX MICROSCOPIC  PROCALCITONIN  I-STAT CG4 LACTIC ACID, ED  I-STAT TROPOININ, ED    Imaging Review No results found.   EKG Interpretation   Date/Time:  Thursday October 14 2014 21:04:50 EST Ventricular Rate:  112 PR Interval:  137 QRS Duration: 133 QT Interval:  370 QTC Calculation: 505 R Axis:   78 Text Interpretation:  Sinus tachycardia Right bundle branch block Since  last tracing rate faster Confirmed by YAO  MD, DAVID ([id number]) on 10/14/2014  10:08:46 PM      .CRITICAL CARE Performed by: Margarita Mail   Total critical care time: 42  Critical care time was exclusive of separately billable procedures and treating other patients.  Critical care was necessary to treat or prevent imminent or life-threatening deterioration.  PATIENT WITH SIRS/SEPSIS AND INTERMITTENT ENCEPHALOPATHY SUGGESTING END ORGAN DAMAGE.  Critical care was time spent personally by me on the following activities: development of treatment plan with patient and/or surrogate as well as nursing,  discussions with consultants, evaluation of patient's response to treatment, examination of patient, obtaining history from patient or surrogate, ordering and performing treatments and interventions, ordering and review of laboratory studies, ordering and review of radiographic studies, pulse oximetry and re-evaluation of patient's condition.  MDM   Final diagnoses:  None   8:49 PM BP 167/95 mmHg  Pulse 123  Temp(Src) 101.5 F (38.6 C) (Oral)  Resp 26  Ht 6\' 1"  (1.854 m)  Wt 227 lb (102.967 kg)  BMI 29.96 kg/m2  SpO2 93% Patient here, tachycardic, febrile. He has intermittent altered mental status. Concern for sepsis. Workup initiated with fluid boluses and pain medication ordered.  10:36 PM Filed Vitals:   10/14/14 2015 10/14/14 2115 10/14/14 2130 10/14/14 2145  BP: 169/93 169/93 169/81  154/83  Pulse:  110 115 105  Temp:      TempSrc:      Resp:   17 19  Height:      Weight:      SpO2: 89% 96% 97% 97%   Patient urine appears negative. Lactate is within normal limits, troponin negative. CBC shows white blood cell count of 11,000, hemoglobin slightly low at 12.8. EKG shows a tachycardia and right bundle branch block. This essentially unchanged. Will chest x-ray is unremarkable, although he has had low oxygen saturations throughout. We'll obtain a CT angiogram to rule out a pulmonary embolus. Other causes of fever and tachycardia include spinal epidural abscess, wound infection, although wound appears clean. I doubt meningitis as patient has no headache or visual disturbances.    Patient is without known source of Infection.  Dr. Hal Neer consulted and Dr. Trenton Gammon with round on the patient in the morning. Patietn will be admitted inpatient by Dr. Chancy Milroy. Pt stable in ED with no significant deterioration in condition.  Margarita Mail, PA-C 10/15/14 [place], PA-C 10/15/14 1432  Wandra Arthurs, MD 10/17/14 [phone]

## 2014-10-14 NOTE — Progress Notes (Signed)
ANTIBIOTIC CONSULT NOTE - INITIAL  Pharmacy Consult for Vancomycin and Zosyn Indication: rule out sepsis  No Known Allergies  Patient Measurements: Height: 6\' 1"  (185.4 cm) Weight: 227 lb (102.967 kg) IBW/kg (Calculated) : 79.9  Vital Signs: Temp: 101.5 F (38.6 C) (02/04 1947) Temp Source: Oral (02/04 1947) BP: 154/83 mmHg (02/04 2145) Pulse Rate: 105 (02/04 2145) Intake/Output from previous day:   Intake/Output from this shift:    Labs:  Recent Labs  10/14/14 1957  WBC 11.1*  HGB 12.8*  PLT 236  CREATININE 1.15   Estimated Creatinine Clearance: 88.2 mL/min (by C-G formula based on Cr of 1.15). No results for input(s): VANCOTROUGH, VANCOPEAK, VANCORANDOM, GENTTROUGH, GENTPEAK, GENTRANDOM, TOBRATROUGH, TOBRAPEAK, TOBRARND, AMIKACINPEAK, AMIKACINTROU, AMIKACIN in the last 72 hours.   Microbiology: Recent Results (from the past 720 hour(s))  Surgical pcr screen     Status: None   Collection Time: 10/07/14  4:29 PM  Result Value Ref Range Status   MRSA, PCR NEGATIVE NEGATIVE Final   Staphylococcus aureus NEGATIVE NEGATIVE Final    Comment:        The Xpert SA Assay (FDA approved for NASAL specimens in patients over 26 years of age), is one component of a comprehensive surveillance program.  Test performance has been validated by [facility] for patients greater than or equal to 31 year old. It is not intended to diagnose infection nor to guide or monitor treatment.     Medical History: Past Medical History  Diagnosis Date  . Family history of adverse reaction to anesthesia     sister gets sick with anesthesia  . HTN (hypertension)     was on Norvasc but has been off for several months  . Weakness     both legs occasionally  . Chronic back pain   . GERD (gastroesophageal reflux disease)     hx of-was on meds yrs ago  . History of gastric ulcer   . Nocturia   . RBBB (right bundle branch block)     Medications:   (Not in a hospital  admission) Scheduled:   Infusions:  . sodium chloride 1,000 mL (10/14/14 2237)   Followed by  . sodium chloride     Assessment: 59yo male s/p lumbar fusion surgery 4 days ago presenting with fever and confusion. Pharmacy is consulted to dose vancomycin and zosyn for rule out sepsis. Pt is febrile to 101.5, WBC 11.1, sCr 1.15 with CrCl ~ 70.  Goal of Therapy:  Vancomycin trough level 15-20 mcg/ml  Plan:  Vancomycin 1750mg  IV load followed by 750mg  IV q12h Zosyn 3.375g IV q8h Measure antibiotic drug levels at steady state Follow up culture results, renal function, and clinical course  Andrey Cota. Diona Foley, PharmD Clinical Pharmacist Pager [phone] 10/14/2014,10:44 PM

## 2014-10-14 NOTE — ED Notes (Signed)
Pt. reports persistent low back pain with fever onset Tuesday , lumbar fusion surgery L4/L5 by Dr. Deri Fuelling last Feb . 1 , 2016 .

## 2014-10-15 ENCOUNTER — Encounter (HOSPITAL_COMMUNITY): Payer: Self-pay | Admitting: General Practice

## 2014-10-15 DIAGNOSIS — G8929 Other chronic pain: Secondary | ICD-10-CM | POA: Diagnosis present

## 2014-10-15 DIAGNOSIS — A419 Sepsis, unspecified organism: Secondary | ICD-10-CM | POA: Diagnosis present

## 2014-10-15 DIAGNOSIS — E860 Dehydration: Secondary | ICD-10-CM | POA: Diagnosis present

## 2014-10-15 DIAGNOSIS — Z87891 Personal history of nicotine dependence: Secondary | ICD-10-CM | POA: Diagnosis not present

## 2014-10-15 DIAGNOSIS — Z79899 Other long term (current) drug therapy: Secondary | ICD-10-CM | POA: Diagnosis not present

## 2014-10-15 DIAGNOSIS — K219 Gastro-esophageal reflux disease without esophagitis: Secondary | ICD-10-CM | POA: Diagnosis present

## 2014-10-15 DIAGNOSIS — R911 Solitary pulmonary nodule: Secondary | ICD-10-CM | POA: Diagnosis present

## 2014-10-15 DIAGNOSIS — M549 Dorsalgia, unspecified: Secondary | ICD-10-CM | POA: Diagnosis present

## 2014-10-15 DIAGNOSIS — I10 Essential (primary) hypertension: Secondary | ICD-10-CM | POA: Diagnosis present

## 2014-10-15 DIAGNOSIS — G9340 Encephalopathy, unspecified: Secondary | ICD-10-CM | POA: Diagnosis present

## 2014-10-15 DIAGNOSIS — I4510 Unspecified right bundle-branch block: Secondary | ICD-10-CM | POA: Diagnosis present

## 2014-10-15 DIAGNOSIS — Z981 Arthrodesis status: Secondary | ICD-10-CM | POA: Diagnosis not present

## 2014-10-15 DIAGNOSIS — R509 Fever, unspecified: Secondary | ICD-10-CM | POA: Diagnosis present

## 2014-10-15 DIAGNOSIS — F1010 Alcohol abuse, uncomplicated: Secondary | ICD-10-CM | POA: Diagnosis present

## 2014-10-15 LAB — CBC
HEMATOCRIT: 40.3 % (ref 39.0–52.0)
Hemoglobin: 12.8 g/dL — ABNORMAL LOW (ref 13.0–17.0)
MCH: 32.6 pg (ref 26.0–34.0)
MCHC: 31.8 g/dL (ref 30.0–36.0)
MCV: 102.5 fL — AB (ref 78.0–100.0)
Platelets: 204 10*3/uL (ref 150–400)
RBC: 3.93 MIL/uL — AB (ref 4.22–5.81)
RDW: 12.1 % (ref 11.5–15.5)
WBC: 8.3 10*3/uL (ref 4.0–10.5)

## 2014-10-15 LAB — COMPREHENSIVE METABOLIC PANEL
ALK PHOS: 82 U/L (ref 39–117)
ALT: 14 U/L (ref 0–53)
AST: 17 U/L (ref 0–37)
Albumin: 3.2 g/dL — ABNORMAL LOW (ref 3.5–5.2)
Anion gap: 10 (ref 5–15)
BILIRUBIN TOTAL: 0.8 mg/dL (ref 0.3–1.2)
BUN: 7 mg/dL (ref 6–23)
CALCIUM: 8.8 mg/dL (ref 8.4–10.5)
CHLORIDE: 106 mmol/L (ref 96–112)
CO2: 25 mmol/L (ref 19–32)
Creatinine, Ser: 0.95 mg/dL (ref 0.50–1.35)
GFR calc Af Amer: >90 mL/min (ref >90)
GFR calc non Af Amer: >90 mL/min (ref >90)
Glucose, Bld: 78 mg/dL (ref 70–99)
POTASSIUM: 4.4 mmol/L (ref 3.5–5.1)
Sodium: 141 mmol/L (ref 135–145)
TOTAL PROTEIN: 6.6 g/dL (ref 6.0–8.3)

## 2014-10-15 LAB — INFLUENZA PANEL BY PCR (TYPE A & B)
H1N1 flu by pcr: NOT DETECTED
INFLBPCR: NEGATIVE
Influenza A By PCR: NEGATIVE

## 2014-10-15 LAB — GLUCOSE, CAPILLARY: GLUCOSE-CAPILLARY: 96 mg/dL (ref 70–99)

## 2014-10-15 LAB — TSH: TSH: 1.196 u[IU]/mL (ref 0.350–4.500)

## 2014-10-15 MED ORDER — LORAZEPAM 2 MG/ML IJ SOLN: 1.0000 mg | Freq: Four times a day (QID) | INTRAMUSCULAR | Status: DC | PRN

## 2014-10-15 MED ORDER — ADULT MULTIVITAMIN W/MINERALS CH
1.0000 | ORAL_TABLET | Freq: Every day | ORAL | Status: DC
  Administered 2014-10-15 – 2014-10-16 (×2): 1 via ORAL
  Filled 2014-10-15 (×2): qty 1

## 2014-10-15 MED ORDER — VITAMIN B-1 100 MG PO TABS
100.0000 mg | ORAL_TABLET | Freq: Every day | ORAL | Status: DC
  Administered 2014-10-15 – 2014-10-16 (×2): 100 mg via ORAL
  Filled 2014-10-15 (×2): qty 1

## 2014-10-15 MED ORDER — HYDROMORPHONE HCL 1 MG/ML IJ SOLN
0.5000 mg | INTRAMUSCULAR | Status: DC | PRN
  Administered 2014-10-15: 0.5 mg via INTRAVENOUS
  Filled 2014-10-15: qty 1

## 2014-10-15 MED ORDER — THIAMINE HCL 100 MG/ML IJ SOLN: 100.0000 mg | Freq: Every day | INTRAMUSCULAR | Status: DC

## 2014-10-15 MED ORDER — PIPERACILLIN-TAZOBACTAM 3.375 G IVPB
3.3750 g | Freq: Three times a day (TID) | INTRAVENOUS | Status: DC
  Administered 2014-10-15 – 2014-10-16 (×4): 3.375 g via INTRAVENOUS
  Filled 2014-10-15 (×6): qty 50

## 2014-10-15 MED ORDER — ONDANSETRON HCL 4 MG/2ML IJ SOLN: 4.0000 mg | Freq: Four times a day (QID) | INTRAMUSCULAR | Status: DC | PRN

## 2014-10-15 MED ORDER — SENNOSIDES-DOCUSATE SODIUM 8.6-50 MG PO TABS
1.0000 | ORAL_TABLET | Freq: Two times a day (BID) | ORAL | Status: DC
  Administered 2014-10-15 – 2014-10-16 (×3): 1 via ORAL
  Filled 2014-10-15 (×3): qty 1

## 2014-10-15 MED ORDER — LORAZEPAM 1 MG PO TABS: 1.0000 mg | ORAL_TABLET | Freq: Four times a day (QID) | ORAL | Status: DC | PRN

## 2014-10-15 MED ORDER — VITAMIN B-1 100 MG PO TABS: 100.0000 mg | ORAL_TABLET | Freq: Every day | ORAL | Status: DC

## 2014-10-15 MED ORDER — ONDANSETRON HCL 4 MG PO TABS: 4.0000 mg | ORAL_TABLET | Freq: Four times a day (QID) | ORAL | Status: DC | PRN

## 2014-10-15 MED ORDER — VANCOMYCIN HCL IN DEXTROSE 1-5 GM/200ML-% IV SOLN
1000.0000 mg | Freq: Two times a day (BID) | INTRAVENOUS | Status: DC
  Administered 2014-10-15 – 2014-10-16 (×3): 1000 mg via INTRAVENOUS
  Filled 2014-10-15 (×4): qty 200

## 2014-10-15 MED ORDER — FOLIC ACID 1 MG PO TABS
1.0000 mg | ORAL_TABLET | Freq: Every day | ORAL | Status: DC
Start: 2014-10-15 — End: 2014-10-15

## 2014-10-15 MED ORDER — ACETAMINOPHEN 650 MG RE SUPP: 650.0000 mg | Freq: Four times a day (QID) | RECTAL | Status: DC | PRN

## 2014-10-15 MED ORDER — HYDRALAZINE HCL 20 MG/ML IJ SOLN: 10.0000 mg | Freq: Four times a day (QID) | INTRAMUSCULAR | Status: DC | PRN

## 2014-10-15 MED ORDER — CARVEDILOL 3.125 MG PO TABS
3.1250 mg | ORAL_TABLET | Freq: Two times a day (BID) | ORAL | Status: DC
Start: 2014-10-15 — End: 2014-10-16
  Administered 2014-10-15 – 2014-10-16 (×2): 3.125 mg via ORAL
  Filled 2014-10-15 (×2): qty 1

## 2014-10-15 MED ORDER — ACETAMINOPHEN 325 MG PO TABS
650.0000 mg | ORAL_TABLET | Freq: Four times a day (QID) | ORAL | Status: DC | PRN
  Administered 2014-10-15 (×2): 650 mg via ORAL
  Filled 2014-10-15 (×2): qty 2

## 2014-10-15 MED ORDER — OXYCODONE HCL 5 MG PO TABS
5.0000 mg | ORAL_TABLET | ORAL | Status: DC | PRN
  Administered 2014-10-15 – 2014-10-16 (×6): 5 mg via ORAL
  Filled 2014-10-15 (×7): qty 1

## 2014-10-15 MED ORDER — HEPARIN SODIUM (PORCINE) 5000 UNIT/ML IJ SOLN
5000.0000 [IU] | Freq: Three times a day (TID) | INTRAMUSCULAR | Status: DC
  Administered 2014-10-15 – 2014-10-16 (×3): 5000 [IU] via SUBCUTANEOUS
  Filled 2014-10-15 (×3): qty 1

## 2014-10-15 MED ORDER — DIPHENHYDRAMINE HCL 25 MG PO CAPS
25.0000 mg | ORAL_CAPSULE | Freq: Once | ORAL | Status: AC
  Administered 2014-10-15: 25 mg via ORAL
  Filled 2014-10-15: qty 1

## 2014-10-15 MED ORDER — SODIUM CHLORIDE 0.9 % IV SOLN
INTRAVENOUS | Status: DC
  Administered 2014-10-15: 1000 mL via INTRAVENOUS

## 2014-10-15 MED ORDER — ADULT MULTIVITAMIN W/MINERALS CH: 1.0000 | ORAL_TABLET | Freq: Every day | ORAL | Status: DC

## 2014-10-15 MED ORDER — VANCOMYCIN HCL IN DEXTROSE 1-5 GM/200ML-% IV SOLN
1000.0000 mg | Freq: Three times a day (TID) | INTRAVENOUS | Status: DC
  Filled 2014-10-15 (×2): qty 200

## 2014-10-15 MED ORDER — FOLIC ACID 1 MG PO TABS
1.0000 mg | ORAL_TABLET | Freq: Every day | ORAL | Status: DC
  Administered 2014-10-15 – 2014-10-16 (×2): 1 mg via ORAL
  Filled 2014-10-15 (×2): qty 1

## 2014-10-15 NOTE — Progress Notes (Signed)
Patient arrived to 4N05 from ED. Patient mildly anxious and impulsive but in a good mood. Patient ambulated to bathroom and complained of back pain but tolerated it well. Will continue to monitor patient closely. Burnell Blanks, RN

## 2014-10-15 NOTE — Progress Notes (Signed)
Strict bedrest order reviewed with patient/family. Urinal provided. Will continue to monitor.  Ave Filter, RN

## 2014-10-15 NOTE — Progress Notes (Addendum)
Patient reports painful urination on last void occurrence.  Urine saved.  MD page for further instruction  Nicanor Bake, Student RN Ave Filter, RN  MD called back, no new orders received at this time. Urine saved by family, urine appears yellow, no odor noted. Recent negative urine sample.  Ave Filter, RN

## 2014-10-15 NOTE — Progress Notes (Signed)
ANTIBIOTIC CONSULT NOTE - Follow-up  Pharmacy Consult for Vancomycin and Zosyn Indication: rule out sepsis  No Known Allergies  Patient Measurements: Height: 6\' 1"  (185.4 cm) Weight: 227 lb (102.967 kg) IBW/kg (Calculated) : 79.9  Vital Signs: Temp: 98.1 F (36.7 C) (02/05 0617) Temp Source: Oral (02/05 0617) BP: 163/89 mmHg (02/05 0617) Pulse Rate: 102 (02/05 0617) Intake/Output from previous day:   Intake/Output from this shift: Total I/O In: 360 [P.O.:360] Out: -   Labs:  Recent Labs  10/14/14 1957 10/15/14 0538  WBC 11.1* 8.3  HGB 12.8* 12.8*  PLT 236 204  CREATININE 1.15 0.95   Estimated Creatinine Clearance: 106.8 mL/min (by C-G formula based on Cr of 0.95). No results for input(s): VANCOTROUGH, VANCOPEAK, VANCORANDOM, GENTTROUGH, GENTPEAK, GENTRANDOM, TOBRATROUGH, TOBRAPEAK, TOBRARND, AMIKACINPEAK, AMIKACINTROU, AMIKACIN in the last 72 hours.   Microbiology: Recent Results (from the past 720 hour(s))  Surgical pcr screen     Status: None   Collection Time: 10/07/14  4:29 PM  Result Value Ref Range Status   MRSA, PCR NEGATIVE NEGATIVE Final   Staphylococcus aureus NEGATIVE NEGATIVE Final    Comment:        The Xpert SA Assay (FDA approved for NASAL specimens in patients over 29 years of age), is one component of a comprehensive surveillance program.  Test performance has been validated by [facility] for patients greater than or equal to 54 year old. It is not intended to diagnose infection nor to guide or monitor treatment.    Assessment: 59yo male s/p lumbar fusion surgery 4 days ago presented with fever and confusion. Pharmacy was consulted to dose vancomycin and zosyn for rule out sepsis. Tmax is 101.5 and WBC is now WNL. Antibiotic were mistakenly discontinued overnight so they will be restarted today.   Vanc 2/5>> Zosyn 2/5>>  Goal of Therapy:  Vancomycin trough level 15-20 mcg/ml  Plan:  1. Restart zosyn 3.375gm IV Q8H (4 hr inf) 2.  Vancomycin 1gm IV Q12H 3. F/u renal fxn, C&S, clinical status and trough at [facility], PharmD, BCPS Pager # [phone] 10/15/2014 9:35 AM

## 2014-10-15 NOTE — Progress Notes (Signed)
[facility] PROGRESS NOTE  Austin Calderon DZH:[id number] DOB: 02-18-1956 DOA: 10/14/2014 PCP: Glo Herring., MD  Assessment/Plan: 59 y/o male with PMH of HTN, DJD, Chronic back pains, s/p L 4,5 decompression fusion (2/1) presented with fever, generalized weakness, back pains, altered mental status     1. Fever of unclear etiology; CTA chest: no clear infiltrates, no PE; UA: unremarkable;  -started on empiric IV atx in ED; pend blood cultures; awaiting neurosurgery eval   2. s/p L 4,5 decompression fusion (2/1) -acute on chronic pain; reports uncontrolled pains at home; will cont PO percocet, and use IV dilaudid for severe pains;  -awaiting neurosurgery eval  3. HTN not on meds; will start coreg, to help to control BP, tachycardia  4. Alcohol use, denies h/o DTs, or withdrawals; monitor on CIWA 5. Stable 6 x 4 mm nodule in the anterior right middle lobe, unchanged since 05/16/2013, likely benign. Givenhigh risk for primary bronchogenic neoplasm -recommended follow-up CT approximately in 12 months to confirm 2 year stability. 6. Encephalopathy on admission; resolved, thought possible related to benzo+opioids (patient denies abusing opioids)  -currently patient is alert, oriented; neuro exam non focal; cont monitor   Code Status: full Family Communication: d/w patient, his wife (indicate person spoken with, relationship, and if by phone, the number) Disposition Plan: home pend clinical improvement    Consultants:  Neurosurgery   Procedures:  noen  Antibiotics:  Vanc/zosyn 2/4>>>>   (indicate start date, and stop date if known)  HPI/Subjective: alert  Objective: Filed Vitals:   10/15/14 0617  BP: 163/89  Pulse: 102  Temp: 98.1 F (36.7 C)  Resp: 18    Intake/Output Summary (Last 24 hours) at 10/15/14 0908 Last data filed at 10/15/14 0857  Gross per 24 hour  Intake    360 ml  Output      0 ml  Net    360 ml   Filed Weights   10/14/14 1947  Weight:  102.967 kg (227 lb)    Exam:   General:  alert  Cardiovascular: s1,s2 rrr  Respiratory: CTA BL  Abdomen: soft, nt, nd  Musculoskeletal: no leg edema   Data Reviewed: Basic Metabolic Panel:  Recent Labs Lab 10/14/14 1957 10/15/14 0538  NA 137 141  K 4.3 4.4  CL 98 106  CO2 32 25  GLUCOSE 127* 78  BUN 7 7  CREATININE 1.15 0.95  CALCIUM 9.4 8.8   Liver Function Tests:  Recent Labs Lab 10/14/14 1957 10/15/14 0538  AST 18 17  ALT 14 14  ALKPHOS 88 82  BILITOT 0.8 0.8  PROT 7.3 6.6  ALBUMIN 3.7 3.2*   No results for input(s): LIPASE, AMYLASE in the last 168 hours. No results for input(s): AMMONIA in the last 168 hours. CBC:  Recent Labs Lab 10/14/14 1957 10/15/14 0538  WBC 11.1* 8.3  NEUTROABS 8.5*  --   HGB 12.8* 12.8*  HCT 40.0 40.3  MCV 100.0 102.5*  PLT 236 204   Cardiac Enzymes: No results for input(s): CKTOTAL, CKMB, CKMBINDEX, TROPONINI in the last 168 hours. BNP (last 3 results) No results for input(s): BNP in the last 8760 hours.  ProBNP (last 3 results) No results for input(s): PROBNP in the last 8760 hours.  CBG:  Recent Labs Lab 10/15/14 0647  GLUCAP 96    Recent Results (from the past 240 hour(s))  Surgical pcr screen     Status: None   Collection Time: 10/07/14  4:29 PM  Result Value Ref Range Status  MRSA, PCR NEGATIVE NEGATIVE Final   Staphylococcus aureus NEGATIVE NEGATIVE Final    Comment:        The Xpert SA Assay (FDA approved for NASAL specimens in patients over 39 years of age), is one component of a comprehensive surveillance program.  Test performance has been validated by [facility] for patients greater than or equal to 72 year old. It is not intended to diagnose infection nor to guide or monitor treatment.      Studies: Ct Angio Chest Pe W/cm &/or Wo Cm  10/14/2014   CLINICAL DATA:  Back surgery on 10/11/2014, hypoxic, evaluate for PE  EXAM: CT ANGIOGRAPHY CHEST WITH CONTRAST  TECHNIQUE:  Multidetector CT imaging of the chest was performed using the standard protocol during bolus administration of intravenous contrast. Multiplanar CT image reconstructions and MIPs were obtained to evaluate the vascular anatomy.  CONTRAST:  184mL OMNIPAQUE IOHEXOL 350 MG/ML SOLN  COMPARISON:  Chest radiograph dated 10/14/2014. CT chest dated 05/16/2013.  FINDINGS: Suboptimal contrast opacifies fixation of the pulmonary artery due to bolus timing, with preferential opacification of the aorta. Additionally, there is suboptimal opacification of the segmental/subsegmental pulmonary arteries due to respiratory motion. However, the study remains diagnostic at least to the segmental level.  No evidence of pulmonary embolism.  Mediastinum/Nodes: Heart is normal in size. No pericardial effusion.  Coronary atherosclerosis in the LAD.  Atherosclerotic calcifications of the aortic arch.  No suspicious mediastinal, hilar, or axillary lymphadenopathy.  Visualized thyroid is unremarkable.  Lungs/Pleura: Evaluation of lung parenchyma is constrained by respiratory motion.  Stable 6 x 4 mm nodule in the anterior right middle lobe. Nodular scarring in the lingula. Right apical pleural parenchymal scarring.  Mild paraseptal emphysematous changes.  Stable noncalcified pleural plaques overlying the right middle lobe (series 401/image 73) and left upper lobe (series 401/image 49). No pleural effusion or pneumothorax.  Upper abdomen: Visualized upper abdomen is within normal limits.  Musculoskeletal: Degenerative changes of the mid/lower thoracic spine. Old/healed right lateral 3rd rib fracture (series 401/ image 37).  Review of the MIP images confirms the above findings.  IMPRESSION: No evidence of pulmonary embolism.  Stable 6 x 4 mm nodule in the anterior right middle lobe, unchanged since 05/16/2013, likely benign. Given high risk for primary bronchogenic neoplasm, consider follow-up CT approximately in 12 months to confirm 2 year  stability.  No evidence of acute cardiopulmonary disease.  This recommendation follows the consensus statement: Guidelines for Management of Small Pulmonary Nodules Detected on CT Scans: A Statement from the [employer] as published in Radiology 2005; 237:395-400.   Electronically Signed   By: Julian Hy M.D.   On: 10/14/2014 23:07   Dg Chest Port 1 View  10/14/2014   CLINICAL DATA:  Shortness of breath. Post recent back surgery. Difficulty urinating.  EXAM: PORTABLE CHEST - 1 VIEW  COMPARISON:  Chest radiograph 01/11/2014  FINDINGS: The cardiomediastinal contours are unchanged allowing for differences in technique. Pulmonary vasculature is normal. There is no consolidation, pleural effusion, or pneumothorax. Prominent interstitial markings are stable from prior exam. No acute osseous abnormalities are seen.  IMPRESSION: No acute pulmonary process.   Electronically Signed   By: Jeb Levering M.D.   On: 10/14/2014 21:18    Scheduled Meds: . folic acid  1 mg Oral Daily  . heparin  5,000 Units Subcutaneous 3 times per day  . multivitamin with minerals  1 tablet Oral Daily  . thiamine  100 mg Oral Daily   Continuous Infusions: .  sodium chloride 1,000 mL (10/15/14 0142)    Active Problems:   HTN (hypertension)   Altered mental status   Fever    Time spent: >35 minutes     Kinnie Feil  [facility] Pager [phone]. If 7PM-7AM, please contact night-coverage at [email or web address], password [facility] 10/15/2014, 9:08 AM  LOS: 1 day

## 2014-10-15 NOTE — Progress Notes (Signed)
The patient is a proximally 4 days status post redo L4-5 decompression and fusion. Initially he did quite well with good improvement in his lower extremity pain. Over the past few days he's had difficulty with increasing feelings of generalized weakness with some mild confusion. He is running low-grade fevers. He has had no wound drainage. He's having no new radicular pain.  Currently he is awake and alert. He is oriented and appropriate. He has no evidence of meningeal signs. His wound is healing well. He complains of back pain but denies any radicular symptoms. He states he just feels tired and weak.  Overall I do not see much to be particularly concerned with. I agree with workup for possible sepsis and observation. The patient may be mobilized without restriction. I do not see any indication to reimage his spine at this point. We will follow.

## 2014-10-16 LAB — HEMOGLOBIN A1C
Hgb A1c MFr Bld: 5.5 % (ref 4.8–5.6)
Mean Plasma Glucose: 111 mg/dL

## 2014-10-16 LAB — URINE CULTURE
Colony Count: NO GROWTH
Culture: NO GROWTH

## 2014-10-16 MED ORDER — CYCLOBENZAPRINE HCL 7.5 MG PO TABS: 7.5000 mg | ORAL_TABLET | Freq: Two times a day (BID) | ORAL | Status: DC | PRN

## 2014-10-16 MED ORDER — CARVEDILOL 3.125 MG PO TABS: 3.1250 mg | ORAL_TABLET | Freq: Two times a day (BID) | ORAL | Status: DC

## 2014-10-16 NOTE — Progress Notes (Signed)
Pt d/c to home by car with family. Assessment stable. Prescriptions given. Pt refused to be taken to car by wheelchair.

## 2014-10-16 NOTE — Discharge Summary (Addendum)
Physician Discharge Summary  Patient ID: Austin Calderon MRN: [id number] DOB/AGE: 05/24/1956 59 y.o.  Admit date: 10/14/2014 Discharge date: 10/16/2014  Primary Care Physician:  Glo Herring., MD  Discharge Diagnoses:   . Fever of unknown admission   Recent L4-L5 decompression and fusion surgery (on 2/1)postop day #5  . HTN (hypertension) . acute encephalopathy resolved    Consults:  Neurosurgery, Dr. Earnie Larsson   Recommendations for Outpatient Follow-up:  Please follow blood cultures so far negative, drawn on 2/4   TESTS THAT NEED FOLLOW-UP Blood cultures   DIET: Heart healthy diet    Allergies:  No Known Allergies   Discharge Medications:   Medication List    STOP taking these medications        diazepam 5 MG tablet  Commonly known as:  VALIUM     HYDROcodone-acetaminophen 7.5-325 MG per tablet  Commonly known as:  NORCO     methocarbamol 500 MG tablet  Commonly known as:  ROBAXIN      TAKE these medications        carvedilol 3.125 MG tablet  Commonly known as:  COREG  Take 1 tablet (3.125 mg total) by mouth 2 (two) times daily with a meal.     cyclobenzaprine 7.5 MG tablet  Commonly known as:  FEXMID  Take 1 tablet (7.5 mg total) by mouth 2 (two) times daily as needed for muscle spasms.     diphenhydramine-acetaminophen 25-500 MG Tabs  Commonly known as:  TYLENOL PM  Take 1 tablet by mouth at bedtime as needed (sleep/pain).     oxyCODONE-acetaminophen 10-325 MG per tablet  Commonly known as:  PERCOCET  Take 1-2 tablets by mouth every 4 (four) hours as needed for pain.         Brief H and P: For complete details please refer to admission H and P, but in brief  patient is a 59 year old male who just had L4-L5 decompression and fusion surgery on 2/1. Patient reported to be weak, feeling cold and chills. He did not feel like eating, had a low-grade temp of 99 and wheezing. Patient's wife also felt that he may have been taking too much pain  medicine. His last drink was on Sunday, 4 days prior to admission and patient drinks daily about 4-5 cans of beer  Hospital Course:  Fever with generalized weakness with dehydration, no leukocytosis, pro-calcitonin normal UA negative, urine culture negative, chest x-ray clear, no pulmonary or abdominal symptoms, no evidence of meningeal signs. Influenza test negative.  Blood cultures negative so far. Neurosurgery was consulted, patient was seen by Dr. Trenton Gammon and did not see anything concerning from neurosurgical standpoint. Per neurosurgery, wound is healing well, denied any radicular symptoms, no indication at this time to reimage his spine. Patient is now doing well, he was placed on IV vancomycin and Zosyn for concern for sepsis. He is close to his baseline, confirmed by his wife at the bedside. No source for isolated fever, no clinical signs of infection, no need of antibiotics at this time. I have discontinued Robaxin, valium and hydrocodone. Patient will continue only Percocet as needed. Patient does not want to stay inpatient, PT OT evaluation will be done prior to discharge.  Acute encephalopathy: Completely resolved, patient is back to baseline, confirmed by his wife at the bedside. Likely due to fever and possibly taking too many narcotics. Discontinue hydrocodone, valum and Robaxin.  Hypertension Continue Coreg  Alcohol use Patient was placed on CIWA protocol, currently stable no acute  withdrawals.  Day of Discharge BP 146/76 mmHg  Pulse 92  Temp(Src) 98.1 F (36.7 C) (Oral)  Resp 20  Ht 6\' 1"  (1.854 m)  Wt 102.967 kg (227 lb)  BMI 29.96 kg/m2  SpO2 95%  Physical Exam: General: Alert and awake oriented x3 not in any acute distress. CVS: S1-S2 clear no murmur rubs or gallops Chest: clear to auscultation bilaterally, no wheezing rales or rhonchi Abdomen: soft nontender, nondistended, normal bowel sounds Extremities: no cyanosis, clubbing or edema noted bilaterally Neuro:  Cranial nerves II-XII intact, no focal neurological deficits   The results of significant diagnostics from this hospitalization (including imaging, microbiology, ancillary and laboratory) are listed below for reference.    LAB RESULTS: Basic Metabolic Panel:  Recent Labs Lab 10/14/14 1957 10/15/14 0538  NA 137 141  K 4.3 4.4  CL 98 106  CO2 32 25  GLUCOSE 127* 78  BUN 7 7  CREATININE 1.15 0.95  CALCIUM 9.4 8.8   Liver Function Tests:  Recent Labs Lab 10/14/14 1957 10/15/14 0538  AST 18 17  ALT 14 14  ALKPHOS 88 82  BILITOT 0.8 0.8  PROT 7.3 6.6  ALBUMIN 3.7 3.2*   No results for input(s): LIPASE, AMYLASE in the last 168 hours. No results for input(s): AMMONIA in the last 168 hours. CBC:  Recent Labs Lab 10/14/14 1957 10/15/14 0538  WBC 11.1* 8.3  NEUTROABS 8.5*  --   HGB 12.8* 12.8*  HCT 40.0 40.3  MCV 100.0 102.5*  PLT 236 204   Cardiac Enzymes: No results for input(s): CKTOTAL, CKMB, CKMBINDEX, TROPONINI in the last 168 hours. BNP: Invalid input(s): POCBNP CBG:  Recent Labs Lab 10/15/14 0647  GLUCAP 96    Significant Diagnostic Studies:  Ct Angio Chest Pe W/cm &/or Wo Cm  10/14/2014   CLINICAL DATA:  Back surgery on 10/11/2014, hypoxic, evaluate for PE  EXAM: CT ANGIOGRAPHY CHEST WITH CONTRAST  TECHNIQUE: Multidetector CT imaging of the chest was performed using the standard protocol during bolus administration of intravenous contrast. Multiplanar CT image reconstructions and MIPs were obtained to evaluate the vascular anatomy.  CONTRAST:  128mL OMNIPAQUE IOHEXOL 350 MG/ML SOLN  COMPARISON:  Chest radiograph dated 10/14/2014. CT chest dated 05/16/2013.  FINDINGS: Suboptimal contrast opacifies fixation of the pulmonary artery due to bolus timing, with preferential opacification of the aorta. Additionally, there is suboptimal opacification of the segmental/subsegmental pulmonary arteries due to respiratory motion. However, the study remains diagnostic at  least to the segmental level.  No evidence of pulmonary embolism.  Mediastinum/Nodes: Heart is normal in size. No pericardial effusion.  Coronary atherosclerosis in the LAD.  Atherosclerotic calcifications of the aortic arch.  No suspicious mediastinal, hilar, or axillary lymphadenopathy.  Visualized thyroid is unremarkable.  Lungs/Pleura: Evaluation of lung parenchyma is constrained by respiratory motion.  Stable 6 x 4 mm nodule in the anterior right middle lobe. Nodular scarring in the lingula. Right apical pleural parenchymal scarring.  Mild paraseptal emphysematous changes.  Stable noncalcified pleural plaques overlying the right middle lobe (series 401/image 73) and left upper lobe (series 401/image 49). No pleural effusion or pneumothorax.  Upper abdomen: Visualized upper abdomen is within normal limits.  Musculoskeletal: Degenerative changes of the mid/lower thoracic spine. Old/healed right lateral 3rd rib fracture (series 401/ image 37).  Review of the MIP images confirms the above findings.  IMPRESSION: No evidence of pulmonary embolism.  Stable 6 x 4 mm nodule in the anterior right middle lobe, unchanged since 05/16/2013, likely benign. Given high  risk for primary bronchogenic neoplasm, consider follow-up CT approximately in 12 months to confirm 2 year stability.  No evidence of acute cardiopulmonary disease.  This recommendation follows the consensus statement: Guidelines for Management of Small Pulmonary Nodules Detected on CT Scans: A Statement from the [employer] as published in Radiology 2005; 237:395-400.   Electronically Signed   By: Julian Hy M.D.   On: 10/14/2014 23:07   Dg Chest Port 1 View  10/14/2014   CLINICAL DATA:  Shortness of breath. Post recent back surgery. Difficulty urinating.  EXAM: PORTABLE CHEST - 1 VIEW  COMPARISON:  Chest radiograph 01/11/2014  FINDINGS: The cardiomediastinal contours are unchanged allowing for differences in technique. Pulmonary vasculature is  normal. There is no consolidation, pleural effusion, or pneumothorax. Prominent interstitial markings are stable from prior exam. No acute osseous abnormalities are seen.  IMPRESSION: No acute pulmonary process.   Electronically Signed   By: Jeb Levering M.D.   On: 10/14/2014 21:18      Disposition and Follow-up:    DISPOSITION: Home   DISCHARGE FOLLOW-UP Follow-up Information    Follow up with Glo Herring., MD. Schedule an appointment as soon as possible for a visit in 10 days.   Specialty:  Internal Medicine   Why:  for hospital follow-up   Contact information:   [address] [place] [address] [phone]       Follow up with POOL,HENRY A, MD. Schedule an appointment as soon as possible for a visit in 10 days.   Specialty:  Neurosurgery   Why:  for hospital follow-up   Contact information:   [address] [place] [address] [phone]        Time spent on Discharge: 35 minutes  Signed:   Sephiroth Mcluckie M.D. [facility] 10/16/2014, 2:20 PM Pager: [phone]

## 2014-10-16 NOTE — Evaluation (Signed)
Physical Therapy Evaluation Patient Details Name: Austin Calderon MRN: [id number] DOB: [birth year] Today's Date: 10/16/2014   History of Present Illness  59 y/o male with PMH of HTN, DJD, Chronic back pains, s/p L 4,5 decompression fusion (2/1) presented with fever, generalized weakness, back pains, altered mental status     Clinical Impression  Patient independent with mobility, no physical assist required. Education complete, no further acute PT needs. Will sign off.     Follow Up Recommendations No PT follow up    Equipment Recommendations  None recommended by PT    Recommendations for Other Services       Precautions / Restrictions Precautions Precautions: Back;Fall Precaution Booklet Issued: No Precaution Comments: reviewed spinal precautions, good carry over Required Braces or Orthoses: Spinal Brace Spinal Brace: Lumbar corset;Applied in sitting position Restrictions Weight Bearing Restrictions: No      Mobility  Bed Mobility Overal bed mobility: Modified Independent             General bed mobility comments: good technique and compliance with precautions  Transfers Overall transfer level: Independent Equipment used: None Transfers: Sit to/from Stand              Ambulation/Gait Ambulation/Gait assistance: Independent Ambulation Distance (Feet): 300 Feet   Gait Pattern/deviations: Step-through pattern        Stairs Stairs: Yes Stairs assistance: Modified independent (Device/Increase time) Stair Management: One rail Right Number of Stairs: 5    Wheelchair Mobility    Modified Rankin (Stroke Patients Only)       Balance Overall balance assessment: Needs assistance                                           Pertinent Vitals/Pain Pain Assessment: Faces Pain Score: 4  Pain Location: back  Pain Descriptors / Indicators: Constant Pain Intervention(s): Monitored during session    Home Living Family/patient expects  to be discharged to:: Private residence Living Arrangements: Spouse/significant other Available Help at Discharge: Family;Available 24 hours/day Type of Home: House Home Access: Stairs to enter Entrance Stairs-Rails: None Entrance Stairs-Number of Steps: 2 Home Layout: One level Home Equipment: Walker - standard;Shower seat      Prior Function Level of Independence: Independent               Hand Dominance   Dominant Hand: Right    Extremity/Trunk Assessment                         Communication   Communication: No difficulties  Cognition Arousal/Alertness: Awake/alert Behavior During Therapy: WFL for tasks assessed/performed Overall Cognitive Status: Within Functional Limits for tasks assessed                      General Comments      Exercises        Assessment/Plan    PT Assessment Patent does not need any further PT services  PT Diagnosis Difficulty walking;Acute pain   PT Problem List    PT Treatment Interventions     PT Goals (Current goals can be found in the Care Plan section) Acute Rehab PT Goals PT Goal Formulation: All assessment and education complete, DC therapy    Frequency     Barriers to discharge        Co-evaluation  End of Session Equipment Utilized During Treatment: Back brace Activity Tolerance: Patient tolerated treatment well Patient left: with call bell/phone within reach;with family/visitor present Nurse Communication: Mobility status         Time: 1140-1155 PT Time Calculation (min) (ACUTE ONLY): 15 min   Charges:   PT Evaluation $Initial PT Evaluation Tier I: 1 Procedure     PT G CodesDuncan Calderon 2014-11-03, 12:45 PM Austin Calderon, [place] DPT  [phone]

## 2014-10-19 DIAGNOSIS — Z981 Arthrodesis status: Secondary | ICD-10-CM | POA: Insufficient documentation

## 2014-10-21 LAB — CULTURE, BLOOD (ROUTINE X 2)
CULTURE: NO GROWTH
Culture: NO GROWTH

## 2014-12-21 ENCOUNTER — Other Ambulatory Visit (HOSPITAL_COMMUNITY): Payer: Self-pay | Admitting: Neurosurgery

## 2014-12-21 DIAGNOSIS — S32009K Unspecified fracture of unspecified lumbar vertebra, subsequent encounter for fracture with nonunion: Secondary | ICD-10-CM

## 2014-12-31 ENCOUNTER — Ambulatory Visit (HOSPITAL_COMMUNITY)
Admission: RE | Admit: 2014-12-31 | Discharge: 2014-12-31 | Disposition: A | Discharge status: Home / Self Care | Payer: BLUE CROSS/BLUE SHIELD | Source: Ambulatory Visit | Attending: Neurosurgery | Admitting: Neurosurgery

## 2014-12-31 DIAGNOSIS — S32059K Unspecified fracture of fifth lumbar vertebra, subsequent encounter for fracture with nonunion: Secondary | ICD-10-CM | POA: Insufficient documentation

## 2014-12-31 DIAGNOSIS — S32009K Unspecified fracture of unspecified lumbar vertebra, subsequent encounter for fracture with nonunion: Secondary | ICD-10-CM

## 2014-12-31 DIAGNOSIS — S32049K Unspecified fracture of fourth lumbar vertebra, subsequent encounter for fracture with nonunion: Secondary | ICD-10-CM | POA: Diagnosis present

## 2014-12-31 DIAGNOSIS — X58XXXD Exposure to other specified factors, subsequent encounter: Secondary | ICD-10-CM | POA: Diagnosis not present

## 2014-12-31 DIAGNOSIS — R200 Anesthesia of skin: Secondary | ICD-10-CM | POA: Diagnosis not present

## 2014-12-31 DIAGNOSIS — Z981 Arthrodesis status: Secondary | ICD-10-CM | POA: Insufficient documentation

## 2015-01-04 ENCOUNTER — Other Ambulatory Visit: Payer: Self-pay | Admitting: Neurosurgery

## 2015-01-06 ENCOUNTER — Encounter (HOSPITAL_COMMUNITY): Payer: Self-pay | Admitting: *Deleted

## 2015-01-07 ENCOUNTER — Encounter (HOSPITAL_COMMUNITY)
Admission: RE | Disposition: A | Discharge status: Home / Self Care | Payer: Self-pay | Source: Ambulatory Visit | Attending: Neurosurgery

## 2015-01-07 ENCOUNTER — Observation Stay (HOSPITAL_COMMUNITY)
Admission: RE | Admit: 2015-01-07 | Discharge: 2015-01-07 | Disposition: A | Discharge status: Home / Self Care | Payer: BLUE CROSS/BLUE SHIELD | Source: Ambulatory Visit | Attending: Neurosurgery | Admitting: Neurosurgery

## 2015-01-07 ENCOUNTER — Encounter (HOSPITAL_COMMUNITY): Payer: Self-pay | Admitting: Certified Registered"

## 2015-01-07 ENCOUNTER — Ambulatory Visit (HOSPITAL_COMMUNITY): Payer: BLUE CROSS/BLUE SHIELD | Admitting: Certified Registered"

## 2015-01-07 DIAGNOSIS — K219 Gastro-esophageal reflux disease without esophagitis: Secondary | ICD-10-CM | POA: Insufficient documentation

## 2015-01-07 DIAGNOSIS — I10 Essential (primary) hypertension: Secondary | ICD-10-CM | POA: Insufficient documentation

## 2015-01-07 DIAGNOSIS — Z87891 Personal history of nicotine dependence: Secondary | ICD-10-CM | POA: Insufficient documentation

## 2015-01-07 DIAGNOSIS — T84028A Dislocation of other internal joint prosthesis, initial encounter: Secondary | ICD-10-CM | POA: Diagnosis present

## 2015-01-07 DIAGNOSIS — T8484XA Pain due to internal orthopedic prosthetic devices, implants and grafts, initial encounter: Secondary | ICD-10-CM | POA: Diagnosis present

## 2015-01-07 HISTORY — PX: HARDWARE REMOVAL: SHX979

## 2015-01-07 LAB — BASIC METABOLIC PANEL
Anion gap: 8 (ref 5–15)
BUN: 8 mg/dL (ref 6–23)
CO2: 25 mmol/L (ref 19–32)
Calcium: 9.5 mg/dL (ref 8.4–10.5)
Chloride: 106 mmol/L (ref 96–112)
Creatinine, Ser: 0.89 mg/dL (ref 0.50–1.35)
GFR calc Af Amer: >90 mL/min (ref >90)
GFR calc non Af Amer: >90 mL/min (ref >90)
Glucose, Bld: 109 mg/dL — ABNORMAL HIGH (ref 70–99)
Potassium: 4.5 mmol/L (ref 3.5–5.1)
SODIUM: 139 mmol/L (ref 135–145)

## 2015-01-07 LAB — CBC
HEMATOCRIT: 50 % (ref 39.0–52.0)
Hemoglobin: 16.8 g/dL (ref 13.0–17.0)
MCH: 32.3 pg (ref 26.0–34.0)
MCHC: 33.6 g/dL (ref 30.0–36.0)
MCV: 96.2 fL (ref 78.0–100.0)
PLATELETS: 218 10*3/uL (ref 150–400)
RBC: 5.2 MIL/uL (ref 4.22–5.81)
RDW: 14.7 % (ref 11.5–15.5)
WBC: 7.7 10*3/uL (ref 4.0–10.5)

## 2015-01-07 LAB — SURGICAL PCR SCREEN
MRSA, PCR: NEGATIVE
Staphylococcus aureus: NEGATIVE

## 2015-01-07 SURGERY — REMOVAL, HARDWARE
Anesthesia: General | Site: Back | Laterality: Left

## 2015-01-07 MED ORDER — LACTATED RINGERS IV SOLN
INTRAVENOUS | Status: DC
  Administered 2015-01-07: 50 mL/h via INTRAVENOUS

## 2015-01-07 MED ORDER — NEOSTIGMINE METHYLSULFATE 10 MG/10ML IV SOLN: INTRAVENOUS | Status: DC | PRN

## 2015-01-07 MED ORDER — MENTHOL 3 MG MT LOZG: 1.0000 | LOZENGE | OROMUCOSAL | Status: DC | PRN

## 2015-01-07 MED ORDER — SODIUM CHLORIDE 0.9 % IJ SOLN
3.0000 mL | Freq: Two times a day (BID) | INTRAMUSCULAR | Status: DC
Start: 2015-01-07 — End: 2015-01-07
  Administered 2015-01-07: 3 mL via INTRAVENOUS

## 2015-01-07 MED ORDER — PROPOFOL 10 MG/ML IV BOLUS
INTRAVENOUS | Status: DC | PRN
  Administered 2015-01-07: 150 mg via INTRAVENOUS
  Administered 2015-01-07: 50 mg via INTRAVENOUS

## 2015-01-07 MED ORDER — CEFAZOLIN SODIUM-DEXTROSE 2-3 GM-% IV SOLR
2.0000 g | INTRAVENOUS | Status: AC
  Administered 2015-01-07: 2 g via INTRAVENOUS
  Filled 2015-01-07: qty 50

## 2015-01-07 MED ORDER — ONDANSETRON HCL 4 MG/2ML IJ SOLN
INTRAMUSCULAR | Status: DC | PRN
  Administered 2015-01-07: 4 mg via INTRAVENOUS

## 2015-01-07 MED ORDER — ROCURONIUM BROMIDE 50 MG/5ML IV SOLN
INTRAVENOUS | Status: AC
  Filled 2015-01-07: qty 1

## 2015-01-07 MED ORDER — FENTANYL CITRATE (PF) 100 MCG/2ML IJ SOLN
INTRAMUSCULAR | Status: DC | PRN
  Administered 2015-01-07: 100 ug via INTRAVENOUS
  Administered 2015-01-07 (×3): 50 ug via INTRAVENOUS

## 2015-01-07 MED ORDER — CYCLOBENZAPRINE HCL 10 MG PO TABS: 10.0000 mg | ORAL_TABLET | Freq: Three times a day (TID) | ORAL | Status: DC | PRN

## 2015-01-07 MED ORDER — ARTIFICIAL TEARS OP OINT
TOPICAL_OINTMENT | OPHTHALMIC | Status: DC | PRN
  Administered 2015-01-07: 1 via OPHTHALMIC

## 2015-01-07 MED ORDER — SODIUM CHLORIDE 0.9 % IR SOLN
Status: DC | PRN
  Administered 2015-01-07: 500 mL

## 2015-01-07 MED ORDER — KETOROLAC TROMETHAMINE 30 MG/ML IJ SOLN
30.0000 mg | Freq: Four times a day (QID) | INTRAMUSCULAR | Status: DC
  Administered 2015-01-07: 30 mg via INTRAVENOUS

## 2015-01-07 MED ORDER — OXYCODONE-ACETAMINOPHEN 5-325 MG PO TABS: 1.0000 | ORAL_TABLET | ORAL | Status: DC | PRN

## 2015-01-07 MED ORDER — LIDOCAINE HCL (CARDIAC) 20 MG/ML IV SOLN
INTRAVENOUS | Status: AC
  Filled 2015-01-07: qty 5

## 2015-01-07 MED ORDER — ROCURONIUM BROMIDE 100 MG/10ML IV SOLN
INTRAVENOUS | Status: DC | PRN
  Administered 2015-01-07: 50 mg via INTRAVENOUS

## 2015-01-07 MED ORDER — 0.9 % SODIUM CHLORIDE (POUR BTL) OPTIME
TOPICAL | Status: DC | PRN
  Administered 2015-01-07: 1000 mL

## 2015-01-07 MED ORDER — MUPIROCIN 2 % EX OINT
1.0000 "application " | TOPICAL_OINTMENT | Freq: Once | CUTANEOUS | Status: AC
  Administered 2015-01-07: 1 via TOPICAL
  Filled 2015-01-07: qty 22

## 2015-01-07 MED ORDER — MIDAZOLAM HCL 5 MG/5ML IJ SOLN
INTRAMUSCULAR | Status: DC | PRN
  Administered 2015-01-07: 2 mg via INTRAVENOUS

## 2015-01-07 MED ORDER — OXYCODONE HCL 5 MG PO TABS: 5.0000 mg | ORAL_TABLET | ORAL | Status: DC | PRN

## 2015-01-07 MED ORDER — FENTANYL CITRATE (PF) 250 MCG/5ML IJ SOLN
INTRAMUSCULAR | Status: AC
  Filled 2015-01-07: qty 5

## 2015-01-07 MED ORDER — OXYCODONE-ACETAMINOPHEN 10-325 MG PO TABS: 1.0000 | ORAL_TABLET | ORAL | Status: DC | PRN

## 2015-01-07 MED ORDER — ARTIFICIAL TEARS OP OINT
TOPICAL_OINTMENT | OPHTHALMIC | Status: AC
  Filled 2015-01-07: qty 3.5

## 2015-01-07 MED ORDER — ACETAMINOPHEN 650 MG RE SUPP: 650.0000 mg | RECTAL | Status: DC | PRN

## 2015-01-07 MED ORDER — CEFAZOLIN SODIUM 1-5 GM-% IV SOLN
1.0000 g | Freq: Three times a day (TID) | INTRAVENOUS | Status: DC
  Filled 2015-01-07 (×2): qty 50

## 2015-01-07 MED ORDER — PHENOL 1.4 % MT LIQD
1.0000 | OROMUCOSAL | Status: DC | PRN
Start: 2015-01-07 — End: 2015-01-07

## 2015-01-07 MED ORDER — THROMBIN 20000 UNITS EX SOLR
CUTANEOUS | Status: DC | PRN
  Administered 2015-01-07: 20 mL via TOPICAL

## 2015-01-07 MED ORDER — SUGAMMADEX SODIUM 200 MG/2ML IV SOLN
INTRAVENOUS | Status: DC | PRN
  Administered 2015-01-07: 200 mg via INTRAVENOUS

## 2015-01-07 MED ORDER — ACETAMINOPHEN 325 MG PO TABS: 650.0000 mg | ORAL_TABLET | ORAL | Status: DC | PRN

## 2015-01-07 MED ORDER — MIDAZOLAM HCL 2 MG/2ML IJ SOLN
INTRAMUSCULAR | Status: AC
  Filled 2015-01-07: qty 2

## 2015-01-07 MED ORDER — CYCLOBENZAPRINE HCL 5 MG PO TABS: 7.5000 mg | ORAL_TABLET | Freq: Two times a day (BID) | ORAL | Status: DC | PRN

## 2015-01-07 MED ORDER — LIDOCAINE HCL (CARDIAC) 20 MG/ML IV SOLN
INTRAVENOUS | Status: DC | PRN
Start: 2015-01-07 — End: 2015-01-07
  Administered 2015-01-07: 50 mg via INTRAVENOUS

## 2015-01-07 MED ORDER — BUPIVACAINE HCL (PF) 0.25 % IJ SOLN
INTRAMUSCULAR | Status: DC | PRN
  Administered 2015-01-07: 20 mL

## 2015-01-07 MED ORDER — ONDANSETRON HCL 4 MG/2ML IJ SOLN: 4.0000 mg | INTRAMUSCULAR | Status: DC | PRN

## 2015-01-07 MED ORDER — ONDANSETRON HCL 4 MG/2ML IJ SOLN
INTRAMUSCULAR | Status: AC
  Filled 2015-01-07: qty 2

## 2015-01-07 MED ORDER — KETOROLAC TROMETHAMINE 30 MG/ML IJ SOLN
INTRAMUSCULAR | Status: DC | PRN
  Administered 2015-01-07: 30 mg via INTRAVENOUS

## 2015-01-07 MED ORDER — HYDROMORPHONE HCL 1 MG/ML IJ SOLN
0.5000 mg | INTRAMUSCULAR | Status: DC | PRN
  Administered 2015-01-07: 1 mg via INTRAVENOUS
  Filled 2015-01-07: qty 1

## 2015-01-07 MED ORDER — SODIUM CHLORIDE 0.9 % IJ SOLN: 3.0000 mL | INTRAMUSCULAR | Status: DC | PRN

## 2015-01-07 MED ORDER — HYDROCODONE-ACETAMINOPHEN 5-325 MG PO TABS: 1.0000 | ORAL_TABLET | ORAL | Status: DC | PRN

## 2015-01-07 MED ORDER — KETOROLAC TROMETHAMINE 30 MG/ML IJ SOLN
INTRAMUSCULAR | Status: AC
  Filled 2015-01-07: qty 1

## 2015-01-07 SURGICAL SUPPLY — 48 items
APL SKNCLS STERI-STRIP NONHPOA (GAUZE/BANDAGES/DRESSINGS) ×1
BAG DECANTER FOR FLEXI CONT (MISCELLANEOUS) ×2 IMPLANT
BENZOIN TINCTURE PRP APPL 2/3 (GAUZE/BANDAGES/DRESSINGS) ×2 IMPLANT
BLADE CLIPPER SURG (BLADE) IMPLANT
BRUSH SCRUB EZ PLAIN DRY (MISCELLANEOUS) ×2 IMPLANT
BUR CUTTER 7.0 ROUND (BURR) ×2 IMPLANT
CANISTER SUCT 3000ML PPV (MISCELLANEOUS) ×2 IMPLANT
CONT SPEC 4OZ CLIKSEAL STRL BL (MISCELLANEOUS) ×2 IMPLANT
DECANTER SPIKE VIAL GLASS SM (MISCELLANEOUS) ×2 IMPLANT
DRAPE LAPAROTOMY 100X72X124 (DRAPES) ×2 IMPLANT
DRAPE MICROSCOPE LEICA (MISCELLANEOUS) ×2 IMPLANT
DRAPE POUCH INSTRU U-SHP 10X18 (DRAPES) ×2 IMPLANT
DRAPE PROXIMA HALF (DRAPES) IMPLANT
DRAPE SURG 17X23 STRL (DRAPES) ×4 IMPLANT
DRSG OPSITE POSTOP 4X6 (GAUZE/BANDAGES/DRESSINGS) ×1 IMPLANT
ELECT REM PT RETURN 9FT ADLT (ELECTROSURGICAL) ×2
ELECTRODE REM PT RTRN 9FT ADLT (ELECTROSURGICAL) ×1 IMPLANT
GAUZE SPONGE 4X4 12PLY STRL (GAUZE/BANDAGES/DRESSINGS) ×2 IMPLANT
GAUZE SPONGE 4X4 16PLY XRAY LF (GAUZE/BANDAGES/DRESSINGS) IMPLANT
GLOVE ECLIPSE 9.0 STRL (GLOVE) ×2 IMPLANT
GLOVE EXAM NITRILE LRG STRL (GLOVE) IMPLANT
GLOVE EXAM NITRILE MD LF STRL (GLOVE) IMPLANT
GLOVE EXAM NITRILE XL STR (GLOVE) IMPLANT
GLOVE EXAM NITRILE XS STR PU (GLOVE) IMPLANT
GOWN STRL REUS W/ TWL LRG LVL3 (GOWN DISPOSABLE) IMPLANT
GOWN STRL REUS W/ TWL XL LVL3 (GOWN DISPOSABLE) ×1 IMPLANT
GOWN STRL REUS W/TWL 2XL LVL3 (GOWN DISPOSABLE) IMPLANT
GOWN STRL REUS W/TWL LRG LVL3 (GOWN DISPOSABLE)
GOWN STRL REUS W/TWL XL LVL3 (GOWN DISPOSABLE) ×2
KIT BASIN OR (CUSTOM PROCEDURE TRAY) ×2 IMPLANT
KIT ROOM TURNOVER OR (KITS) ×2 IMPLANT
LIQUID BAND (GAUZE/BANDAGES/DRESSINGS) ×2 IMPLANT
NDL SPNL 22GX3.5 QUINCKE BK (NEEDLE) ×1 IMPLANT
NEEDLE HYPO 22GX1.5 SAFETY (NEEDLE) ×2 IMPLANT
NEEDLE SPNL 22GX3.5 QUINCKE BK (NEEDLE) ×2 IMPLANT
NS IRRIG 1000ML POUR BTL (IV SOLUTION) ×2 IMPLANT
PACK LAMINECTOMY NEURO (CUSTOM PROCEDURE TRAY) ×2 IMPLANT
PAD ARMBOARD 7.5X6 YLW CONV (MISCELLANEOUS) ×6 IMPLANT
RUBBERBAND STERILE (MISCELLANEOUS) ×4 IMPLANT
SPONGE SURGIFOAM ABS GEL 100 (HEMOSTASIS) ×2 IMPLANT
STRIP CLOSURE SKIN 1/2X4 (GAUZE/BANDAGES/DRESSINGS) ×2 IMPLANT
SUT VIC AB 2-0 CT1 18 (SUTURE) ×2 IMPLANT
SUT VIC AB 3-0 SH 8-18 (SUTURE) ×2 IMPLANT
SYR 20ML ECCENTRIC (SYRINGE) ×2 IMPLANT
TAPE STRIPS DRAPE STRL (GAUZE/BANDAGES/DRESSINGS) ×1 IMPLANT
TOWEL OR 17X24 6PK STRL BLUE (TOWEL DISPOSABLE) ×2 IMPLANT
TOWEL OR 17X26 10 PK STRL BLUE (TOWEL DISPOSABLE) ×2 IMPLANT
WATER STERILE IRR 1000ML POUR (IV SOLUTION) ×2 IMPLANT

## 2015-01-07 NOTE — Transfer of Care (Signed)
Immediate Anesthesia Transfer of Care Note  Patient: Austin Calderon  Procedure(s) Performed: Procedure(s) with comments: HARDWARE REMOVAL LEFT L4-5 PEDICLE SCREWS (Left) - HARDWARE REMOVAL LEFT L4-5 PEDICLE SCREWS  Patient Location: PACU  Anesthesia Type:General  Level of Consciousness: awake, alert  and oriented  Airway & Oxygen Therapy: Patient Spontanous Breathing and Patient connected to nasal cannula oxygen  Post-op Assessment: Report given to RN and Patient moving all extremities X 4  Post vital signs: Reviewed and stable  Last Vitals:  Filed Vitals:   01/07/15 1144  Pulse: 83  Temp: 37.1 C  Resp: 18    Complications: No apparent anesthesia complications

## 2015-01-07 NOTE — Discharge Instructions (Signed)

## 2015-01-07 NOTE — Progress Notes (Signed)
Patient is discharged form room 3C04 at this time. Alert and in stable condition. IV site d/c'd. Instructions read to patient and understanding verbalized. Left unit via wheelchair with all belongings at side.

## 2015-01-07 NOTE — Op Note (Signed)
Date of procedure: 01/07/2015  Date of dictation: Same  Service: Neurosurgery  Preoperative diagnosis: Mispositioned left L5 pedicle screw with radiculopathy  Postoperative diagnosis: Same  Procedure Name: Reexploration of posterior lateral fusion with removal of painful instrumentation  Surgeon:Jaxsun Ciampi A.Aristea Posada, M.D.  Asst. 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 Patient now 2 months postoperatively. Initially patient has done very well but over the last couple of weeks is developed left lower extremity radicular pain consistent with an L5 radicular pattern. Workup demonstrates evidence of a medially positioned left L5 pedicle screw likely causing some L5 nerve root irritation. On a positive note the patient's previous cage fusion L4-5 now appears to be healed. Plan is for reexploration of his fusion with removal of symptomatic hardware.  Operative note:After induction anesthesia, patient position prone onto Wilson frame and appropriately padded. Lumbar region prepped and draped. Incision made overlying the pedicle screws on the left at L4-5. Dissection performed down to the lumbodorsal fashion. Lumbodorsal fascia incised. Paraspinal muscles split and pedicle screw instrumentation dissected free. Pedicle screws were disassembled and hardware was removed. Screws at both L4 and L5 on the left side were removed. There was no evidence of complication or other problem. Fusion was inspected and found to be quite solid. Wound is then irrigated with antibiotic solution. Wound is then closed in layers with Vicryl sutures. No apparent complications. Patient tolerated the procedure well and he returns to the recovery room postop.

## 2015-01-07 NOTE — Anesthesia Postprocedure Evaluation (Signed)
  Anesthesia Post-op Note  Patient: Austin Calderon  Procedure(s) Performed: Procedure(s) with comments: HARDWARE REMOVAL LEFT L4-5 PEDICLE SCREWS (Left) - HARDWARE REMOVAL LEFT L4-5 PEDICLE SCREWS  Patient Location: PACU  Anesthesia Type:General  Level of Consciousness: awake, alert  and oriented  Airway and Oxygen Therapy: Patient Spontanous Breathing and Patient connected to nasal cannula oxygen  Post-op Pain: mild  Post-op Assessment: Post-op Vital signs reviewed, Patient's Cardiovascular Status Stable, Respiratory Function Stable, Patent Airway and Pain level controlled  Post-op Vital Signs: stable  Last Vitals:  Filed Vitals:   01/07/15 1514  BP: 169/86  Pulse: 63  Temp: 36.6 C  Resp: 16    Complications: No apparent anesthesia complications

## 2015-01-07 NOTE — Plan of Care (Signed)
Problem: Consults Goal: Diagnosis - Spinal Surgery Outcome: Completed/Met Date Met:  01/07/15 LUMBAR HARDWARE REMOVAL

## 2015-01-07 NOTE — Discharge Summary (Signed)
Physician Discharge Summary  Patient ID: Austin Calderon MRN: [id number] DOB/AGE: 58-Apr-1957 58 y.o.  Admit date: 01/07/2015 Discharge date: 01/07/2015  Admission Diagnoses:  Discharge Diagnoses:  Principal Problem:   Painful orthopaedic hardware   Discharged Condition: good  Hospital Course: Patient underwent reexploration of lumbar fusion with removal of painful hardware. Postoperatively doing well. Back and leg pain much improved. Ready for discharge home.  Consults:   Significant Diagnostic Studies:   Treatments:   Discharge Exam: Blood pressure 169/86, pulse 63, temperature 97.8 F (36.6 C), temperature source Oral, resp. rate 16, height 6' (1.829 m), weight 100.245 kg (221 lb), SpO2 97 %. Awake and alert. Oriented and appropriate. Motor and sensory function intact. Wound clean and dry. Chest and abdomen benign. Disposition: 01-Home or Self Care     Medication List    TAKE these medications        carvedilol 3.125 MG tablet  Commonly known as:  COREG  Take 1 tablet (3.125 mg total) by mouth 2 (two) times daily with a meal.     cyclobenzaprine 7.5 MG tablet  Commonly known as:  FEXMID  Take 1 tablet (7.5 mg total) by mouth 2 (two) times daily as needed for muscle spasms.     diphenhydramine-acetaminophen 25-500 MG Tabs  Commonly known as:  TYLENOL PM  Take 1 tablet by mouth at bedtime as needed (sleep/pain).     oxyCODONE-acetaminophen 10-325 MG per tablet  Commonly known as:  PERCOCET  Take 1-2 tablets by mouth every 4 (four) hours as needed for pain.           Follow-up Information    Follow up with Charlie Pitter, MD.   Specialty:  Neurosurgery   Contact information:   [address] [place] [address] [phone]       Signed: Charlie Pitter 01/07/2015, 4:53 PM

## 2015-01-07 NOTE — Anesthesia Procedure Notes (Signed)
Procedure Name: Intubation Date/Time: 01/07/2015 1:27 PM Performed by: Sampson Si E Pre-anesthesia Checklist: Patient identified, Emergency Drugs available, Suction available, Patient being monitored and Timeout performed Patient Re-evaluated:Patient Re-evaluated prior to inductionOxygen Delivery Method: Circle system utilized Preoxygenation: Pre-oxygenation with 100% oxygen Intubation Type: IV induction Ventilation: Mask ventilation without difficulty and Oral airway inserted - appropriate to patient size Laryngoscope Size: Mac and 4 Grade View: Grade I Tube type: Oral Tube size: 7.5 mm Number of attempts: 1 Airway Equipment and Method: Stylet Placement Confirmation: ETT inserted through vocal cords under direct vision,  positive ETCO2 and breath sounds checked- equal and bilateral Secured at: 22 cm Tube secured with: Tape Dental Injury: Teeth and Oropharynx as per pre-operative assessment

## 2015-01-07 NOTE — H&P (Signed)
Austin Calderon is an 59 y.o. male.   Chief Complaint: Left leg pain HPI: 59 year old male status post L4-5 revision posterior lateral fusion for treatment of interbody pseudoarthrosis. Patient initially is done quite well with marked improvement of his back and preoperative right lower extremity pain. Over the past few weeks he has developed left lower extremity radicular pain consistent with an L5 radicular pattern. Workup has demonstrated evidence of a medially disposition pedicle screw which certainly is irritating his left L5 nerve root. CT scan demonstrates good appearance is interbody fusion now at L4-5. Plan is for simple removal of his symptomatic instrumentation.  Past Medical History  Diagnosis Date  . Family history of adverse reaction to anesthesia     sister gets sick with anesthesia  . HTN (hypertension)     was on Norvasc but has been off for several months  . Weakness     both legs occasionally  . Chronic back pain   . GERD (gastroesophageal reflux disease)     hx of-was on meds yrs ago  . History of gastric ulcer   . Nocturia   . RBBB (right bundle branch block)     Past Surgical History  Procedure Laterality Date  . Spinal fusion    . Knee arthroscopy Right   . Esophagogastroduodenoscopy    . Lumbar fusion    . Back surgery      Family History  Problem Relation Age of Onset  . Breast cancer Mother    Social History:  reports that he has quit smoking. He has never used smokeless tobacco. He reports that he drinks alcohol. He reports that he does not use illicit drugs.  Allergies:  Allergies  Allergen Reactions  . Valium [Diazepam] Other (See Comments)    "makes me crazy"    Medications Prior to Admission  Medication Sig Dispense Refill  . cyclobenzaprine (FEXMID) 7.5 MG tablet Take 1 tablet (7.5 mg total) by mouth 2 (two) times daily as needed for muscle spasms. 30 tablet 1  . diphenhydramine-acetaminophen (TYLENOL PM) 25-500 MG TABS Take 1 tablet by  mouth at bedtime as needed (sleep/pain).    Marland Kitchen oxyCODONE-acetaminophen (PERCOCET) 10-325 MG per tablet Take 1-2 tablets by mouth every 4 (four) hours as needed for pain. 90 tablet 0  . carvedilol (COREG) 3.125 MG tablet Take 1 tablet (3.125 mg total) by mouth 2 (two) times daily with a meal. 60 tablet 3    Results for orders placed or performed during the hospital encounter of 01/07/15 (from the past 48 hour(s))  CBC     Status: None   Collection Time: 01/07/15 12:42 PM  Result Value Ref Range   WBC 7.7 4.0 - 10.5 K/uL   RBC 5.20 4.22 - 5.81 MIL/uL   Hemoglobin 16.8 13.0 - 17.0 g/dL   HCT 50.0 39.0 - 52.0 %   MCV 96.2 78.0 - 100.0 fL   MCH 32.3 26.0 - 34.0 pg   MCHC 33.6 30.0 - 36.0 g/dL   RDW 14.7 11.5 - 15.5 %   Platelets 218 150 - 400 K/uL   No results found.  Pertinent items are noted in HPI.  Pulse 83, temperature 98.7 F (37.1 C), temperature source Oral, resp. rate 18, height 6' (1.829 m), weight 100.245 kg (221 lb), SpO2 96 %.  Awake and alert. Oriented and appropriate. Radial nerve function intact. Motor and sensory of the extremities intact. Straight leg raising positive on the left negative on the right. Wound clean and dry.  Chest and abdomen benign. Extremities free from injury or deformity. Assessment/Plan Left L5 pedicle screw disposition with resultant radiculopathy. Plan reexploration of lumbar fusion with removal of symptomatic instrumentation. Risks and benefits of been explained. Patient wishes to proceed.  Austin Calderon A 01/07/2015, 1:04 PM

## 2015-01-07 NOTE — Anesthesia Preprocedure Evaluation (Addendum)
Anesthesia Evaluation  Patient identified by MRN, date of birth, ID band Patient awake    Reviewed: Allergy & Precautions, NPO status , Patient's Chart, lab work & pertinent test results  Airway Mallampati: II  TM Distance: >3 FB Neck ROM: Full    Dental  (+) Teeth Intact, Dental Advisory Given   Pulmonary former smoker,  breath sounds clear to auscultation        Cardiovascular hypertension, Rhythm:Regular Rate:Normal     Neuro/Psych    GI/Hepatic GERD-  ,  Endo/Other    Renal/GU      Musculoskeletal   Abdominal   Peds  Hematology   Anesthesia Other Findings   Reproductive/Obstetrics                          Anesthesia Physical Anesthesia Plan  ASA: III  Anesthesia Plan: General   Post-op Pain Management:    Induction: Intravenous  Airway Management Planned: Oral ETT  Additional Equipment:   Intra-op Plan:   Post-operative Plan: Extubation in OR  Informed Consent: I have reviewed the patients History and Physical, chart, labs and discussed the procedure including the risks, benefits and alternatives for the proposed anesthesia with the patient or authorized representative who has indicated his/her understanding and acceptance.   Dental advisory given  Plan Discussed with: CRNA and Anesthesiologist  Anesthesia Plan Comments:        Anesthesia Quick Evaluation

## 2015-01-07 NOTE — Brief Op Note (Signed)
01/07/2015  2:07 PM  PATIENT:  Austin Calderon  59 y.o. male  PRE-OPERATIVE DIAGNOSIS:  Painful hardware  POST-OPERATIVE DIAGNOSIS:  Painful hardware  PROCEDURE:  Procedure(s) with comments: HARDWARE REMOVAL LEFT L4-5 PEDICLE SCREWS (Left) - HARDWARE REMOVAL LEFT L4-5 PEDICLE SCREWS  SURGEON:  Surgeon(s) and Role:    * Earnie Larsson, MD - Primary  PHYSICIAN ASSISTANT:   ASSISTANTS: none   ANESTHESIA:   general  EBL:  Total I/O In: 500 [I.V.:500] Out: -   BLOOD ADMINISTERED:none  DRAINS: none   LOCAL MEDICATIONS USED:  MARCAINE     SPECIMEN:  No Specimen  DISPOSITION OF SPECIMEN:  N/A  COUNTS:  YES  TOURNIQUET:  * No tourniquets in log *  DICTATION: .Dragon Dictation  PLAN OF CARE: Admit for overnight observation  PATIENT DISPOSITION:  PACU - hemodynamically stable.   Delay start of Pharmacological VTE agent (>24hrs) due to surgical blood loss or risk of bleeding: yes

## 2015-01-10 ENCOUNTER — Encounter (HOSPITAL_COMMUNITY): Payer: Self-pay | Admitting: Neurosurgery

## 2016-04-28 IMAGING — RF DG C-ARM 61-120 MIN
1 series · 2 of 2 positions shown · non-contrast
Comparison: CT 01/22/2014 .

CLINICAL DATA: Lumbar spine for surgery .

EXAM:
DG C-ARM 61-120 MIN; LUMBAR SPINE - 2-3 VIEW
FLUOROSCOPY TIME:  0 min 29 seconds.

[Series 1: run · 2 of 2 slices shown]
[im 1/2]
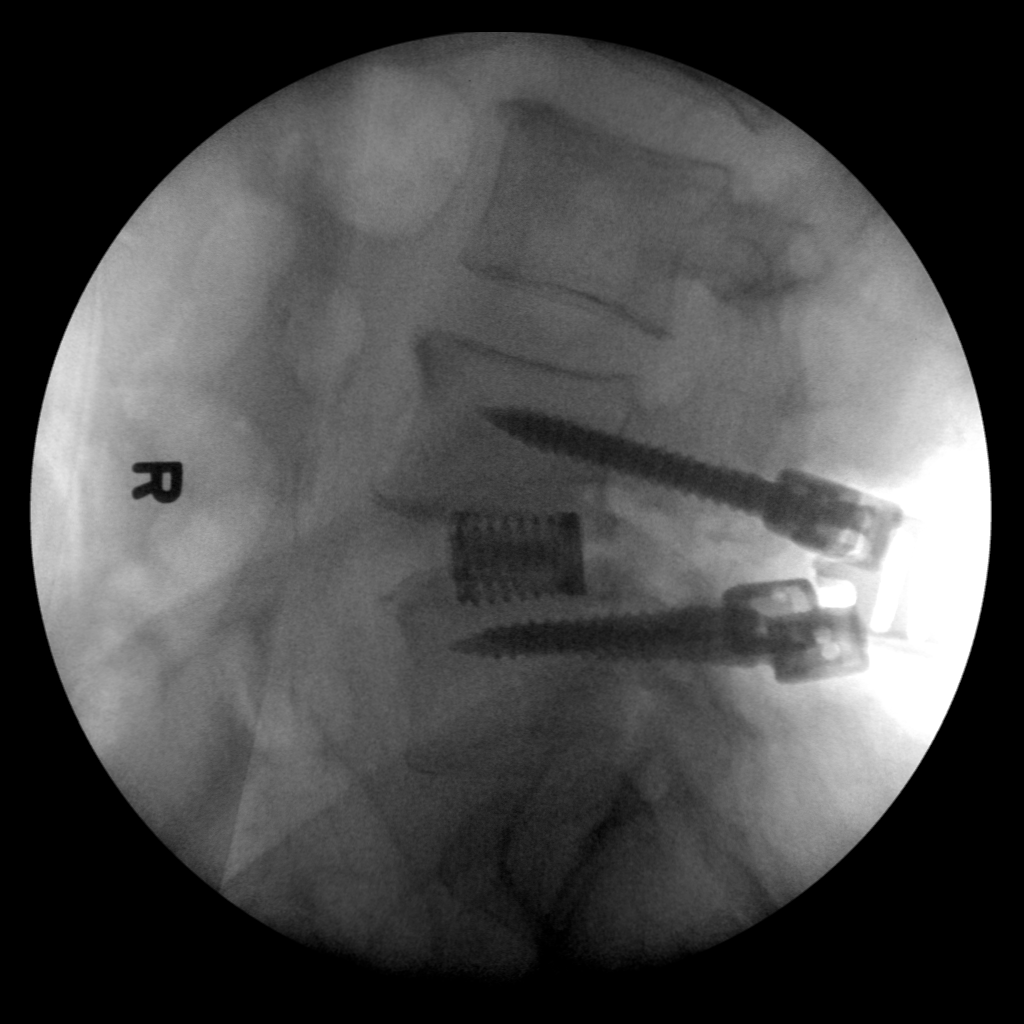
[im 2/2]
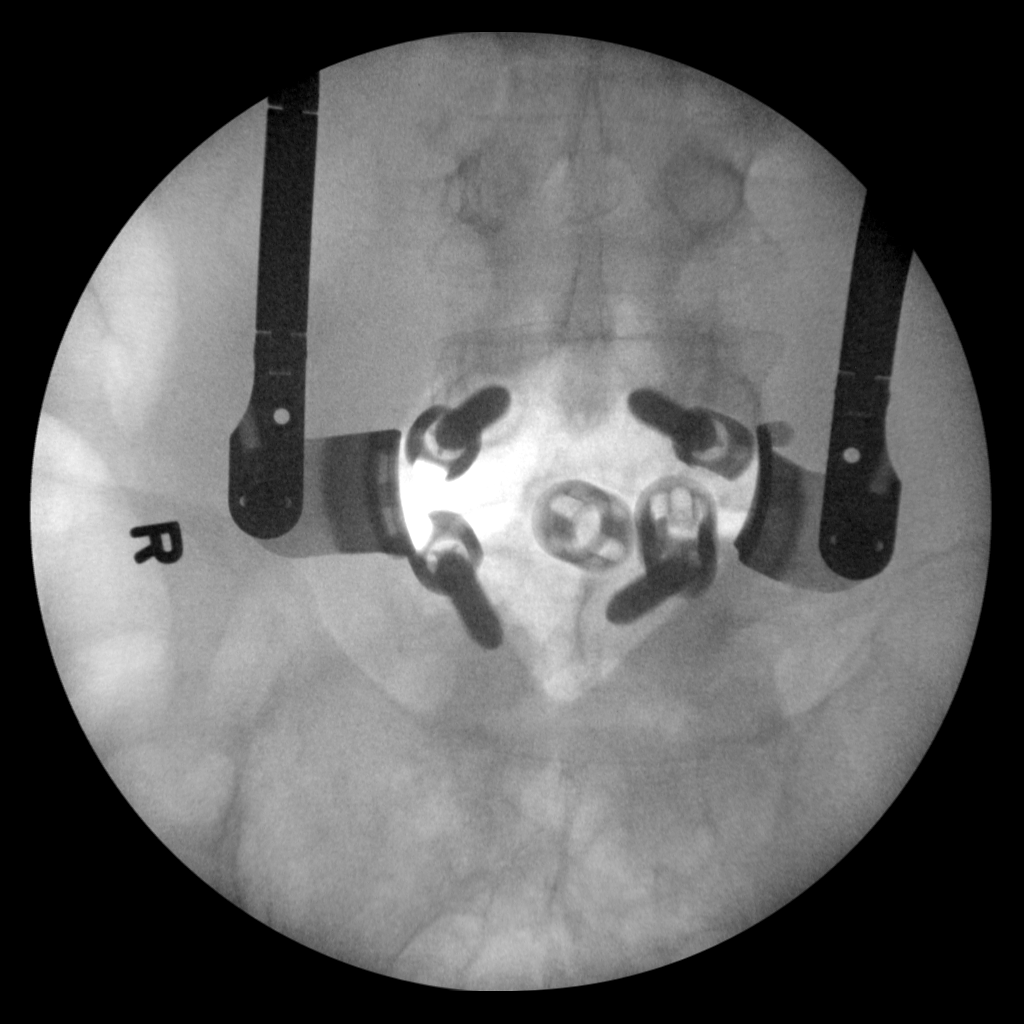

[2 of 2 positions shown; findings below may reference images not displayed]

FINDINGS: Lumbar Spine number dense per prior CT of 08/24/2014. L4-L5 pedicle
screws and inter disc fusion noted. Good anatomic alignment noted.
IMPRESSION: L4-L5 posterior and interbody fusion as above.

## 2016-11-05 ENCOUNTER — Other Ambulatory Visit (HOSPITAL_COMMUNITY): Payer: Self-pay | Admitting: Sports Medicine

## 2016-11-05 DIAGNOSIS — G8929 Other chronic pain: Secondary | ICD-10-CM

## 2016-11-05 DIAGNOSIS — M25561 Pain in right knee: Principal | ICD-10-CM

## 2016-11-09 ENCOUNTER — Ambulatory Visit (HOSPITAL_COMMUNITY): Payer: BLUE CROSS/BLUE SHIELD

## 2016-11-27 ENCOUNTER — Encounter (INDEPENDENT_AMBULATORY_CARE_PROVIDER_SITE_OTHER): Payer: Self-pay | Admitting: *Deleted

## 2016-12-19 ENCOUNTER — Ambulatory Visit (HOSPITAL_COMMUNITY): Payer: BLUE CROSS/BLUE SHIELD | Attending: Orthopedic Surgery | Admitting: Physical Therapy

## 2016-12-19 DIAGNOSIS — M6281 Muscle weakness (generalized): Secondary | ICD-10-CM | POA: Diagnosis present

## 2016-12-19 DIAGNOSIS — M25661 Stiffness of right knee, not elsewhere classified: Secondary | ICD-10-CM | POA: Diagnosis present

## 2016-12-19 DIAGNOSIS — M25561 Pain in right knee: Secondary | ICD-10-CM | POA: Diagnosis present

## 2016-12-19 NOTE — Therapy (Signed)
[facility] [address] [place], [place], [address] Phone: [phone]   Fax:  [phone]  Physical Therapy Evaluation  Patient Details  Name: Austin Calderon MRN: [id number] Date of Birth: May 15, 1956 Referring Provider: Elsie Saas  Encounter Date: 12/19/2016      PT End of Session - 12/19/16 1339    Visit Number 1   Number of Visits 8   Date for PT Re-Evaluation 01/18/17   Authorization Type [employer]   Authorization - Visit Number 1   Authorization - Number of Visits 8   PT Start Time 1583   PT Stop Time 1345   PT Time Calculation (min) 40 min   Equipment Utilized During Treatment Gait belt   Activity Tolerance Patient tolerated treatment well   Behavior During Therapy [facility] for tasks assessed/performed      Past Medical History:  Diagnosis Date  . Chronic back pain   . Family history of adverse reaction to anesthesia    sister gets sick with anesthesia  . GERD (gastroesophageal reflux disease)    hx of-was on meds yrs ago  . History of gastric ulcer   . HTN (hypertension)    was on Norvasc but has been off for several months  . Nocturia   . RBBB (right bundle branch block)   . Weakness    both legs occasionally    Past Surgical History:  Procedure Laterality Date  . BACK SURGERY    . ESOPHAGOGASTRODUODENOSCOPY    . HARDWARE REMOVAL Left 01/07/2015   Procedure: HARDWARE REMOVAL LEFT L4-5 PEDICLE SCREWS;  Surgeon: Earnie Larsson, MD;  Location: [facility];  Service: Neurosurgery;  Laterality: Left;  HARDWARE REMOVAL LEFT L4-5 PEDICLE SCREWS  . KNEE ARTHROSCOPY Right   . LUMBAR FUSION    . SPINAL FUSION      There were no vitals filed for this visit.       Subjective Assessment - 12/19/16 1302    Subjective Austin Calderon had Rt arthroscopic surgery on 12/01/2016.  He is still having pain when he lies in bed; he takes pain medication to get to sleep.  He has been referred to skilled physical therapy.     Pertinent  History HTN   How long can you sit comfortably? all day   How long can you stand comfortably? Takes the weight off of his right leg in less than five minutes    How long can you walk comfortably? less than five minutes    Patient Stated Goals decrease pain, to be able to hike, play golf and garden    Currently in Pain? No/denies  highest pain is a 7/10    Aggravating Factors  activity    Pain Relieving Factors ice and medication    Effect of Pain on Daily Activities increases             [facility] PT Assessment - 12/19/16 0001      Assessment   Medical Diagnosis Rt arthroscopic surgery    Referring Provider Elsie Saas   Onset Date/Surgical Date 12/01/16  approximate   Next MD Visit 12/27/2016   Prior Therapy none     Precautions   Precautions None     Restrictions   Weight Bearing Restrictions No     Balance Screen   Has the patient fallen in the past 6 months No   Has the patient had a decrease in activity level because of a fear of falling?  Yes  Is the patient reluctant to leave their home because of a fear of falling?  No     Home Ecologist residence   Home Access Stairs to enter   Entrance Stairs-Number of Steps 3  unable to go down reciprocal      Prior Function   Level of Independence Independent   Vocation Full time employment   Vocation Requirements mainly sitting  but occasionally will lift 30# at waist height    Leisure golf, hike, garden     Cognition   Overall Cognitive Status Within Functional Limits for tasks assessed     Observation/Other Assessments   Focus on Therapeutic Outcomes (FOTO)  55     Observation/Other Assessments-Edema    Edema --  popliteal crease: Rt 46.5; LT 40.5     Functional Tests   Functional tests Single leg stance;Sit to Stand     Single Leg Stance   Comments Lt:  60;  RT 30     Sit to Stand   Comments RT     ROM / Strength   AROM / PROM / Strength AROM;Strength     AROM   AROM  Assessment Site Knee   Right/Left Knee Right   Right Knee Extension 3   Right Knee Flexion 100     Strength   Strength Assessment Site Hip;Knee;Ankle   Right/Left Hip Right;Left   Right Hip Flexion 5/5   Right Hip Extension 4/5   Right Hip ABduction 5/5   Right/Left Knee Right;Left   Right Knee Flexion 4/5   Right Knee Extension 5/5   Left Knee Flexion 5/5   Right/Left Ankle Right;Left   Right Ankle Dorsiflexion 5/5                   [facility] Adult PT Treatment/Exercise - 12/19/16 0001      Exercises   Exercises Knee/Hip     Knee/Hip Exercises: Stretches   Active Hamstring Stretch Right;2 reps;30 seconds     Knee/Hip Exercises: Standing   SLS x3     Knee/Hip Exercises: Supine   Quad Sets Right;10 reps   Heel Slides Right;5 reps   Bridges 10 reps                PT Education - 12/19/16 1343    Education provided Yes   Education Details HEP, ice every two hours for ten minutes to decrease swelling    Person(s) Educated Patient   Methods Explanation;Handout   Comprehension Verbalized understanding          PT Short Term Goals - 12/19/16 1344      PT SHORT TERM GOAL #1   Title Pt  right kneeROM to be at 0 to allow more normalized gait    Time 1   Period Weeks   Status New     PT SHORT TERM GOAL #2   Title Pt Rt knee flexion to be to 115 to allow pt to bend down to pick items off of the ground    Time 2   Period Weeks   Status New     PT SHORT TERM GOAL #3   Title PT right knee pain to be no greater than a 3/10 to decrease on pain medication needed to sleep    Time 2   Period Weeks   Status New           PT Long Term Goals - 12/19/16 1345  PT LONG TERM GOAL #1   Title Pt to be able to go up and down a flight of steps in reciprocal manner    Time 3   Period Weeks   Status New     PT LONG TERM GOAL #2   Title Pt to be able to walk for an hour without increased pain to get back to hiking    Time 4   Period Weeks   Status  New     PT LONG TERM GOAL #3   Title Pt right knee pain to be at the most a 1/10 to be able to sleep without pain   Time 4   Period Weeks   Status New     PT LONG TERM GOAL #4   Title Pt to be able to return to playing golf without right knee pain    Time 4   Period Weeks   Status New               Plan - 12/19/16 1339    Clinical Impression Statement Austin Calderon is a 61 yo male who has been referred to skilled physical therapy due to a right arthroscopic surgery completed approximately three weeks ago.  Austin Calderon evaluation demonstrates decreased strength, decreased balance, increased pain  and increased edema with decreased functional tolerance.  Austin Calderon will benefit from skilled physical therapy to address these issues and maximize his functional ability.    Rehab Potential Good   PT Frequency 2x / week   PT Duration 4 weeks   PT Treatment/Interventions ADLs/Self Care Home Management;Manual techniques;Patient/family education;Therapeutic activities;Stair training;Gait training;Therapeutic exercise;Balance training   PT Next Visit Plan Begin heel toe gait training, rockerboard, heel raises, minisquats, forward and lateral step ups.  Add manual if swelling has not decreased.    PT Home Exercise Plan Q-set, heelslides, active hamstring stretch and bridging.       Patient will benefit from skilled therapeutic intervention in order to improve the following deficits and impairments:  Abnormal gait, Decreased activity tolerance, Decreased balance, Decreased range of motion, Decreased strength, Increased edema, Pain  Visit Diagnosis: Acute pain of right knee - Plan: PT plan of care cert/re-cert  Stiffness of right knee, not elsewhere classified - Plan: PT plan of care cert/re-cert  Muscle weakness (generalized) - Plan: PT plan of care cert/re-cert     Problem List Patient Active Problem List   Diagnosis Date Noted  . Painful orthopaedic hardware ([place]) 01/07/2015  . S/P  lumbar fusion   . HTN (hypertension) 10/14/2014  . Altered mental status 10/14/2014  . Fever 10/14/2014  . Lumbar pseudoarthrosis 10/11/2014  . Muscle wasting and atrophy, NEC, unsp lower leg 06/04/2013  . Weakness of left leg 06/04/2013  . Left knee pain 06/04/2013    Rayetta Humphrey, PT CLT [phone] 12/19/2016, 1:50 PM  [facility] [address] [place], [place], [address] Phone: [phone]   Fax:  [phone]  Name: Austin Calderon MRN: [id number] Date of Birth: [birth year]

## 2016-12-19 NOTE — Patient Instructions (Addendum)
Strengthening: Quadriceps Set    Tighten muscles on top of thighs by pushing knees down into surface. Hold __3__ seconds. Repeat __10__ times per set. Do ___1_ sets per session. Do __2__ sessions per day.  http://orth.exer.us/602   Copyright  VHI. All rights reserved.  Self-Mobilization: Heel Slide (Supine)    Slide left heel toward buttocks until a gentle stretch is felt. Hold _3___ seconds. Relax. Repeat __10__ times per set. Do _1___ sets per session. Do 2____ sessions per day.  http://orth.exer.us/710   Copyright  VHI. All rights reserved.  Stretching: Hamstring (Supine)    Supporting right thigh behind knee, slowly straighten knee until stretch is felt in back of thigh. Hold 30____ seconds. Repeat __3__ times per set. Do _1___ sets per session. Do ___2_ sessions per day.  http://orth.exer.us/656   Copyright  VHI. All rights reserved.  Bridging    Slowly raise buttocks from floor, keeping stomach tight. Repeat __10__ times per set. Do __1__ sets per session. Do 2____ sessions per day.  http://orth.exer.us/1096   Copyright  VHI. All rights reserved.

## 2016-12-26 ENCOUNTER — Ambulatory Visit (HOSPITAL_COMMUNITY): Payer: BLUE CROSS/BLUE SHIELD

## 2016-12-26 DIAGNOSIS — M25561 Pain in right knee: Secondary | ICD-10-CM | POA: Diagnosis not present

## 2016-12-26 DIAGNOSIS — M25661 Stiffness of right knee, not elsewhere classified: Secondary | ICD-10-CM

## 2016-12-26 DIAGNOSIS — M6281 Muscle weakness (generalized): Secondary | ICD-10-CM

## 2016-12-26 NOTE — Therapy (Signed)
[place], [place], [address] Phone: [phone]   Fax:  [phone]  Physical Therapy Treatment  Patient Details  Name: Austin Austin MRN: [id number] Date of Birth: 21-Aug-1956 Referring Provider: Elsie Saas  Encounter Date: 12/26/2016      PT End of Session - 12/26/16 1317    Visit Number 2   Number of Visits 8   Date for PT Re-Evaluation 01/18/17   Authorization Type BCBS   Authorization - Visit Number 2   Authorization - Number of Visits 8   PT Start Time 0175   PT Stop Time 1342   PT Time Calculation (min) 40 min   Activity Tolerance Patient tolerated treatment well;No increased pain   Behavior During Therapy WFL for tasks assessed/performed      Past Medical History:  Diagnosis Date  . Chronic back pain   . Family history of adverse reaction to anesthesia    sister gets sick with anesthesia  . GERD (gastroesophageal reflux disease)    hx of-was on meds yrs ago  . History of gastric ulcer   . HTN (hypertension)    was on Norvasc but has been off for several months  . Nocturia   . RBBB (right bundle branch block)   . Weakness    both legs occasionally    Past Surgical History:  Procedure Laterality Date  . BACK SURGERY    . ESOPHAGOGASTRODUODENOSCOPY    . HARDWARE REMOVAL Left 01/07/2015   Procedure: HARDWARE REMOVAL LEFT L4-5 PEDICLE SCREWS;  Surgeon: Earnie Larsson, MD;  Location: [facility];  Service: Neurosurgery;  Laterality: Left;  HARDWARE REMOVAL LEFT L4-5 PEDICLE SCREWS  . KNEE ARTHROSCOPY Right   . LUMBAR FUSION    . SPINAL FUSION      There were no vitals filed for this visit.      Subjective Assessment - 12/26/16 1307    Subjective Pt reports compliance with HEP and goes to [facility] and rides bike.  No reports of pain currently.     Pertinent History HTN   Patient Stated Goals decrease pain, to be able to hike, play golf and garden    Currently in Pain? No/denies                          [facility] Adult PT Treatment/Exercise - 12/26/16 0001      Ambulation/Gait   Ambulation Distance (Feet) 226 Feet   Assistive device None   Gait Comments cueing for heel to toe pattern equal stance phase (encouraged to wear tennis shoes rather than flip flop next session)     Knee/Hip Exercises: Stretches   Active Hamstring Stretch Right;30 seconds;3 reps   Active Hamstring Stretch Limitations supine with rope   Knee: Self-Stretch Limitations knee drive on 10CH step 85I 10"     Knee/Hip Exercises: Standing   Heel Raises 15 reps   Heel Raises Limitations c/o increased knee pain with toe raises    Lateral Step Up Right;10 reps;Hand Hold: 1;Step Height: 4"   Forward Step Up 10 reps;Step Height: 4"   Functional Squat 10 reps   Functional Squat Limitations minisquat with cueing for form   Rocker Board 2 minutes   Rocker Board Limitations R/L     Knee/Hip Exercises: Seated   Sit to Sand 10 reps;without UE support     Knee/Hip Exercises: Supine   Quad Sets Right;10 reps   Short Arc [employer]  15 reps   Heel Slides 2 sets;10 reps   Knee Extension Limitations 2 degrees   Knee Flexion Limitations 115 degrees     Manual Therapy   Manual Therapy Edema management   Manual therapy comments Manual complete separate rest of tx   Edema Management Retro massage with LE elevated for edema control                PT Education - 12/26/16 1320    Education provided Yes   Education Details Reviewed goals, compliance with HEP, copy of eval given to pt.  Encouraged to wear proper shoes with therex.     Methods Explanation;Demonstration;Handout   Comprehension Verbalized understanding;Returned demonstration          PT Short Term Goals - 12/19/16 1344      PT SHORT TERM GOAL #1   Title Pt  right kneeROM to be at 0 to allow more normalized gait    Time 1   Period Weeks   Status New     PT SHORT TERM GOAL #2   Title Pt Rt knee flexion to be to  115 to allow pt to bend down to pick items off of the ground    Time 2   Period Weeks   Status New     PT SHORT TERM GOAL #3   Title PT right knee pain to be no greater than a 3/10 to decrease on pain medication needed to sleep    Time 2   Period Weeks   Status New           PT Long Term Goals - 12/19/16 1345      PT LONG TERM GOAL #1   Title Pt to be able to go up and down a flight of steps in reciprocal manner    Time 3   Period Weeks   Status New     PT LONG TERM GOAL #2   Title Pt to be able to walk for an hour without increased pain to get back to hiking    Time 4   Period Weeks   Status New     PT LONG TERM GOAL #3   Title Pt right knee pain to be at the most a 1/10 to be able to sleep without pain   Time 4   Period Weeks   Status New     PT LONG TERM GOAL #4   Title Pt to be able to return to playing golf without right knee pain    Time 4   Period Weeks   Status New               Plan - 12/26/16 1340    Clinical Impression Statement Reviewed gols, assured compliance with HEP and copy of eval given to pt.  Pt arrived with noted increased swelling proximal knee.  Manual techniques complete for edema control prior ROM based therex.  Pt with vast improvements noted with AROM 2-115 following manual and therex functional stretches.  Gait training with cueing for equal stance phase and heel to toe pattern, pt wore flip flop to this session, was encouraged to wear tennis shoes next session.  Progressed to functional strengthening with therapist facilitaiton for proper form and overall mechanics.  No reports of pain through session.  Encouraged pt to apply ice for edema and control following session.     Rehab Potential Good   PT Frequency 2x / week   PT Duration 4 weeks  PT Treatment/Interventions ADLs/Self Care Home Management;Manual techniques;Patient/family education;Therapeutic activities;Stair training;Gait training;Therapeutic exercise;Balance training    PT Next Visit Plan Gait training with appropriate shoes heel to toe gait mechanics, add standing TKE, continue CKC minisquats, forward and lateral step ups, functional stretches for knee flexion.  Continue wiht manual PRN for edema control.      Patient will benefit from skilled therapeutic intervention in order to improve the following deficits and impairments:  Abnormal gait, Decreased activity tolerance, Decreased balance, Decreased range of motion, Decreased strength, Increased edema, Pain  Visit Diagnosis: Acute pain of right knee  Stiffness of right knee, not elsewhere classified  Muscle weakness (generalized)     Problem List Patient Active Problem List   Diagnosis Date Noted  . Painful orthopaedic hardware ([place]) 01/07/2015  . S/P lumbar fusion   . HTN (hypertension) 10/14/2014  . Altered mental status 10/14/2014  . Fever 10/14/2014  . Lumbar pseudoarthrosis 10/11/2014  . Muscle wasting and atrophy, NEC, unsp lower leg 06/04/2013  . Weakness of left leg 06/04/2013  . Left knee pain 06/04/2013   Austin Austin, LPTA; [place]  Austin Austin 12/26/2016, 3:56 PM  [place], [place], [address] Phone: [phone]   Fax:  [phone]  Name: Austin Austin MRN: [id number] Date of Birth: 1956-01-12

## 2016-12-28 ENCOUNTER — Ambulatory Visit (HOSPITAL_COMMUNITY): Payer: BLUE CROSS/BLUE SHIELD

## 2016-12-28 DIAGNOSIS — M25561 Pain in right knee: Secondary | ICD-10-CM | POA: Diagnosis not present

## 2016-12-28 DIAGNOSIS — M25661 Stiffness of right knee, not elsewhere classified: Secondary | ICD-10-CM

## 2016-12-28 NOTE — Therapy (Signed)
[place] [facility] [address] [place], [place], [address] Phone: [phone]   Fax:  [phone]  Physical Therapy Treatment  Patient Details  Name: Austin Calderon MRN: [id number] Date of Birth: December 29, 1955 Referring Provider: Elsie Saas  Encounter Date: 12/28/2016      PT End of Session - 12/28/16 1316    Visit Number 3   Number of Visits 8   Date for PT Re-Evaluation 01/18/17   Authorization Type BCBS   Authorization - Visit Number 3   Authorization - Number of Visits 8   PT Start Time 6546   PT Stop Time 5035   PT Time Calculation (min) 40 min   Activity Tolerance Patient tolerated treatment well;No increased pain   Behavior During Therapy WFL for tasks assessed/performed      Past Medical History:  Diagnosis Date  . Chronic back pain   . Family history of adverse reaction to anesthesia    sister gets sick with anesthesia  . GERD (gastroesophageal reflux disease)    hx of-was on meds yrs ago  . History of gastric ulcer   . HTN (hypertension)    was on Norvasc but has been off for several months  . Nocturia   . RBBB (right bundle branch block)   . Weakness    both legs occasionally    Past Surgical History:  Procedure Laterality Date  . BACK SURGERY    . ESOPHAGOGASTRODUODENOSCOPY    . HARDWARE REMOVAL Left 01/07/2015   Procedure: HARDWARE REMOVAL LEFT L4-5 PEDICLE SCREWS;  Surgeon: Earnie Larsson, MD;  Location: [facility];  Service: Neurosurgery;  Laterality: Left;  HARDWARE REMOVAL LEFT L4-5 PEDICLE SCREWS  . KNEE ARTHROSCOPY Right   . LUMBAR FUSION    . SPINAL FUSION      There were no vitals filed for this visit.      Subjective Assessment - 12/28/16 1310    Subjective Pt reports compliance with HEP and goes to [facility] and rides bike up to 12 minutes now.  Was at Surgeons office yesterday and is nwo on prednisone. He refused a fluid draw.    Pertinent History HTN   Currently in Pain? Yes   Pain Score 2   Left  knee                         [facility] Adult PT Treatment/Exercise - 12/28/16 0001      Ambulation/Gait   Ambulation Distance (Feet) 450 Feet   Assistive device None   Gait velocity 1.28m/s     Knee/Hip Exercises: Standing   Hip Flexion Left;2 sets;10 reps;Knee bent  to 90 degrees, SLS balance contralat   Lateral Step Up 2 sets;Step Height: 4";Hand Hold: 0;Right   Forward Step Up 2 sets;Hand Hold: 0;Step Height: 4";Right     Knee/Hip Exercises: Seated   Sit to Sand 2 sets;10 reps;without UE support  cues for equal WB      Knee/Hip Exercises: Supine   Quad Sets Right;15 reps  15x3sec hold   Short Arc [employer] Strengthening;Right;2 sets;10 reps   Short Arc Quad Sets Limitations 2lb   Heel Slides AROM;Right;15 reps  a15x3sec hold   Bridges Both;2 sets;10 reps  noted weakness in extensors;    Straight Leg Raises 2 sets;10 reps;Left  quads lag absent   Straight Leg Raises Limitations bent knee raise: 2x10 with 2lb ankle weight    Patellar Mobs 2x30sec inferior/superior/medial/lateral  some pain with  inferior glide, likely due to compresion   Knee Extension PROM   Knee Extension Limitations 4   Knee Flexion PROM   Knee Flexion Limitations 112     SLS on Airex Pad: 10x5sec              PT Short Term Goals - 12/19/16 1344      PT SHORT TERM GOAL #1   Title Pt  right kneeROM to be at 0 to allow more normalized gait    Time 1   Period Weeks   Status New     PT SHORT TERM GOAL #2   Title Pt Rt knee flexion to be to 115 to allow pt to bend down to pick items off of the ground    Time 2   Period Weeks   Status New     PT SHORT TERM GOAL #3   Title PT right knee pain to be no greater than a 3/10 to decrease on pain medication needed to sleep    Time 2   Period Weeks   Status New           PT Long Term Goals - 12/19/16 1345      PT LONG TERM GOAL #1   Title Pt to be able to go up and down a flight of steps in reciprocal manner    Time 3    Period Weeks   Status New     PT LONG TERM GOAL #2   Title Pt to be able to walk for an hour without increased pain to get back to hiking    Time 4   Period Weeks   Status New     PT LONG TERM GOAL #3   Title Pt right knee pain to be at the most a 1/10 to be able to sleep without pain   Time 4   Period Weeks   Status New     PT LONG TERM GOAL #4   Title Pt to be able to return to playing golf without right knee pain    Time 4   Period Weeks   Status New               Plan - 12/28/16 1320    Clinical Impression Statement Pt toelrating session well. COntinued focus on strengthing or RLE, knee and hip, as well as improving ROM in knee. Pt had one instance of shooting pain during SAQ, but immediately abated without repeat. Making progress toward goal overall.     Rehab Potential Good   PT Frequency 2x / week   PT Duration 4 weeks   PT Treatment/Interventions ADLs/Self Care Home Management;Manual techniques;Patient/family education;Therapeutic activities;Stair training;Gait training;Therapeutic exercise;Balance training   PT Next Visit Plan Continue with hip strength and Knee ROM focus. Begin step ups as tolerated.    PT Home Exercise Plan Q-set, heelslides, active hamstring stretch and bridging.    Consulted and Agree with Plan of Care Patient      Patient will benefit from skilled therapeutic intervention in order to improve the following deficits and impairments:  Abnormal gait, Decreased activity tolerance, Decreased balance, Decreased range of motion, Decreased strength, Increased edema, Pain  Visit Diagnosis: Acute pain of right knee  Stiffness of right knee, not elsewhere classified     Problem List Patient Active Problem List   Diagnosis Date Noted  . Painful orthopaedic hardware ([place]) 01/07/2015  . S/P lumbar fusion   . HTN (hypertension) 10/14/2014  . Altered  mental status 10/14/2014  . Fever 10/14/2014  . Lumbar pseudoarthrosis 10/11/2014  .  Muscle wasting and atrophy, NEC, unsp lower leg 06/04/2013  . Weakness of left leg 06/04/2013  . Left knee pain 06/04/2013   1:39 PM, 12/28/16 Etta Grandchild, PT, DPT Physical Therapist at [facility] [phone] (office)      Etta Grandchild 12/28/2016, 1:37 PM  [facility] [address] [place], [place], [address] Phone: [phone]   Fax:  [phone]  Name: Austin Calderon MRN: [id number] Date of Birth: July 22, 1956

## 2017-01-02 ENCOUNTER — Ambulatory Visit (HOSPITAL_COMMUNITY): Payer: BLUE CROSS/BLUE SHIELD

## 2017-01-02 DIAGNOSIS — M6281 Muscle weakness (generalized): Secondary | ICD-10-CM

## 2017-01-02 DIAGNOSIS — M25661 Stiffness of right knee, not elsewhere classified: Secondary | ICD-10-CM

## 2017-01-02 DIAGNOSIS — M25561 Pain in right knee: Secondary | ICD-10-CM

## 2017-01-02 NOTE — Therapy (Signed)
[place], [place], [address] Phone: [phone]   Fax:  [phone]  Physical Therapy Treatment  Patient Details  Name: Austin Calderon MRN: [id number] Date of Birth: Jun 23, 1956 Referring Provider: Elsie Saas  Encounter Date: 01/02/2017      PT End of Session - 01/02/17 1313    Visit Number 4   Number of Visits 8   Date for PT Re-Evaluation 01/18/17   Authorization Type BCBS   Authorization - Visit Number 4   Authorization - Number of Visits 8   PT Start Time 1301   PT Stop Time 5956   PT Time Calculation (min) 41 min   Activity Tolerance Patient tolerated treatment well;No increased pain   Behavior During Therapy WFL for tasks assessed/performed      Past Medical History:  Diagnosis Date  . Chronic back pain   . Family history of adverse reaction to anesthesia    sister gets sick with anesthesia  . GERD (gastroesophageal reflux disease)    hx of-was on meds yrs ago  . History of gastric ulcer   . HTN (hypertension)    was on Norvasc but has been off for several months  . Nocturia   . RBBB (right bundle branch block)   . Weakness    both legs occasionally    Past Surgical History:  Procedure Laterality Date  . BACK SURGERY    . ESOPHAGOGASTRODUODENOSCOPY    . HARDWARE REMOVAL Left 01/07/2015   Procedure: HARDWARE REMOVAL LEFT L4-5 PEDICLE SCREWS;  Surgeon: Earnie Larsson, MD;  Location: [facility];  Service: Neurosurgery;  Laterality: Left;  HARDWARE REMOVAL LEFT L4-5 PEDICLE SCREWS  . KNEE ARTHROSCOPY Right   . LUMBAR FUSION    . SPINAL FUSION      There were no vitals filed for this visit.      Subjective Assessment - 01/02/17 1311    Subjective Pt stated he is feeling good.  Stated knee continues to swell, pt stated he returns on 01/22/17 and thinks the MD will draw fluid.   Patient Stated Goals decrease pain, to be able to hike, play golf and garden    Currently in Pain? No/denies                         [facility] Adult PT Treatment/Exercise - 01/02/17 0001      Ambulation/Gait   Ambulation Distance (Feet) 552 Feet   Assistive device None     Knee/Hip Exercises: Stretches   Active Hamstring Stretch Right;30 seconds;3 reps   Active Hamstring Stretch Limitations supine with sheet     Knee/Hip Exercises: Standing   Terminal Knee Extension Limitations GTB 15x 5"   Lateral Step Up 15 reps;Hand Hold: 1;Step Height: 4"   Forward Step Up Right;2 sets;10 reps;Hand Hold: 0;Step Height: 4";Step Height: 6"  6in height 2nd set   Functional Squat 15 reps   Functional Squat Limitations minisquat with cueing for form   SLS Rt 49", Lt 24" max of 3     Knee/Hip Exercises: Seated   Sit to Sand 10 reps;without UE support  minimal cueing for equal stance phase     Knee/Hip Exercises: Supine   Short Arc [employer] 15 reps   Short Arc Quad Sets Limitations 2#   Heel Slides AROM;Right;15 reps   Bridges 15 reps   Straight Leg Raises 10 reps   Straight Leg Raises Limitations 3 degrees lacking TKE  Patellar Mobs Manual complete   Knee Extension AROM   Knee Extension Limitations 3   Knee Flexion AROM   Knee Flexion Limitations 117     Manual Therapy   Manual Therapy Edema management;Joint mobilization   Manual therapy comments Manual complete separate rest of tx   Edema Management Retro massage with LE elevated for edema control   Joint Mobilization patella mobs all directions and tib/fib                  PT Short Term Goals - 12/19/16 1344      PT SHORT TERM GOAL #1   Title Pt  right kneeROM to be at 0 to allow more normalized gait    Time 1   Period Weeks   Status New     PT SHORT TERM GOAL #2   Title Pt Rt knee flexion to be to 115 to allow pt to bend down to pick items off of the ground    Time 2   Period Weeks   Status New     PT SHORT TERM GOAL #3   Title PT right knee pain to be no greater than a 3/10 to decrease on pain medication  needed to sleep    Time 2   Period Weeks   Status New           PT Long Term Goals - 12/19/16 1345      PT LONG TERM GOAL #1   Title Pt to be able to go up and down a flight of steps in reciprocal manner    Time 3   Period Weeks   Status New     PT LONG TERM GOAL #2   Title Pt to be able to walk for an hour without increased pain to get back to hiking    Time 4   Period Weeks   Status New     PT LONG TERM GOAL #3   Title Pt right knee pain to be at the most a 1/10 to be able to sleep without pain   Time 4   Period Weeks   Status New     PT LONG TERM GOAL #4   Title Pt to be able to return to playing golf without right knee pain    Time 4   Period Weeks   Status New               Plan - 01/02/17 1343    Clinical Impression Statement Pt progressing well towards goals.   Ambulates with no AD with good mechanics and reports increased ease with functional activities.  Pt arrived with notable increased edema proximal knee. Manual retro massage complete for edema control followed by ROM based therex.  Improving AROM at 3-117 degrees today.  Progressed to functional strengthening with good form noted wiht majority of therex.  Added TKE to improve knee extension with gait and able to increase height with forward steps.  Visible muscle fatigue noted with increased demand due to weakness, no reports of increased pain through session.  Reviewed application of ice for pain and edema control, pt encouraged to reduce ice time and increase frequency for edema control.     Rehab Potential Good   PT Frequency 2x / week   PT Duration 4 weeks   PT Treatment/Interventions ADLs/Self Care Home Management;Manual techniques;Patient/family education;Therapeutic activities;Stair training;Gait training;Therapeutic exercise;Balance training   PT Next Visit Plan Continue with hip strength and Knee ROM focus.  Step down training next session.  Manual PRN for edema control.     PT Home Exercise  Plan Q-set, heelslides, active hamstring stretch and bridging.       Patient will benefit from skilled therapeutic intervention in order to improve the following deficits and impairments:  Abnormal gait, Decreased activity tolerance, Decreased balance, Decreased range of motion, Decreased strength, Increased edema, Pain  Visit Diagnosis: Acute pain of right knee  Stiffness of right knee, not elsewhere classified  Muscle weakness (generalized)     Problem List Patient Active Problem List   Diagnosis Date Noted  . Painful orthopaedic hardware ([place]) 01/07/2015  . S/P lumbar fusion   . HTN (hypertension) 10/14/2014  . Altered mental status 10/14/2014  . Fever 10/14/2014  . Lumbar pseudoarthrosis 10/11/2014  . Muscle wasting and atrophy, NEC, unsp lower leg 06/04/2013  . Weakness of left leg 06/04/2013  . Left knee pain 06/04/2013   Ihor Austin, LPTA; [place]  Aldona Lento 01/02/2017, 2:35 PM  [place], [place], [address] Phone: [phone]   Fax:  [phone]  Name: Austin Calderon MRN: [id number] Date of Birth: 02-13-1956

## 2017-01-04 ENCOUNTER — Ambulatory Visit (HOSPITAL_COMMUNITY): Payer: BLUE CROSS/BLUE SHIELD

## 2017-01-04 DIAGNOSIS — M6281 Muscle weakness (generalized): Secondary | ICD-10-CM

## 2017-01-04 DIAGNOSIS — M25661 Stiffness of right knee, not elsewhere classified: Secondary | ICD-10-CM

## 2017-01-04 DIAGNOSIS — M25561 Pain in right knee: Secondary | ICD-10-CM | POA: Diagnosis not present

## 2017-01-04 NOTE — Therapy (Signed)
[place], [place], [address] Phone: [phone]   Fax:  [phone]  Physical Therapy Treatment  Patient Details  Name: Austin Calderon MRN: [id number] Date of Birth: September 01, 1956 Referring Provider: Elsie Saas  Encounter Date: 01/04/2017      PT End of Session - 01/04/17 1339    Visit Number 5   Number of Visits 8   Date for PT Re-Evaluation 01/18/17   Authorization Type BCBS   Authorization - Visit Number 5   Authorization - Number of Visits 8   PT Start Time 1300   PT Stop Time 1340   PT Time Calculation (min) 40 min   Equipment Utilized During Treatment Gait belt   Activity Tolerance Patient tolerated treatment well;No increased pain;Patient limited by fatigue   Behavior During Therapy [facility] for tasks assessed/performed      Past Medical History:  Diagnosis Date  . Chronic back pain   . Family history of adverse reaction to anesthesia    sister gets sick with anesthesia  . GERD (gastroesophageal reflux disease)    hx of-was on meds yrs ago  . History of gastric ulcer   . HTN (hypertension)    was on Norvasc but has been off for several months  . Nocturia   . RBBB (right bundle branch block)   . Weakness    both legs occasionally    Past Surgical History:  Procedure Laterality Date  . BACK SURGERY    . ESOPHAGOGASTRODUODENOSCOPY    . HARDWARE REMOVAL Left 01/07/2015   Procedure: HARDWARE REMOVAL LEFT L4-5 PEDICLE SCREWS;  Surgeon: Earnie Larsson, MD;  Location: [place] [facility];  Service: Neurosurgery;  Laterality: Left;  HARDWARE REMOVAL LEFT L4-5 PEDICLE SCREWS  . KNEE ARTHROSCOPY Right   . LUMBAR FUSION    . SPINAL FUSION      There were no vitals filed for this visit.      Subjective Assessment - 01/04/17 1307    Subjective Pt with a lot of pain in hip since last visit from lots of stretching. He is starting feel somewhat better but has been taking it easy. Lateral hip pain has been worse  with sustained gait.    Pertinent History HTN   Currently in Pain? Yes   Pain Score 1                          [facility] Adult PT Treatment/Exercise - 01/04/17 0001      Ambulation/Gait   Ambulation Distance (Feet) 675 Feet   Assistive device None   Gait velocity 1.17m/s   Gait Comments lateral hip pain after 400 feet, bilat with (+) crossover gait     Knee/Hip Exercises: Stretches   Other Knee/Hip Stretches Sustained knee traction at 100 degrees flexion: 10lb x 5 minutes      Knee/Hip Exercises: Standing   SLS SLS 5x10sec on Airex pad   SLS with Vectors 5x10sec bilat, alteranting     Knee/Hip Exercises: Seated   Sit to Sand 2 sets;10 reps;without UE support  cues for equal WB      Knee/Hip Exercises: Supine   Short Arc Quad Sets Right;Strengthening;3 sets;10 reps  4lb (1set); 7.5lb (3sets)    Short Arc Quad Sets Limitations 7.5   Bridges Both;2 sets;15 reps   Straight Leg Raises 2 sets;Both;15 reps     Manual Therapy   Manual Therapy Soft tissue mobilization   Manual therapy comments  Instrument Assisted Soft TIssue Mobilization: distal quads 2 minutes while in traction    Joint Mobilization PAtella mobs: unsuccessful, pt guarding  Medial tibial rotational glide 2x30sec at 90degrees flexion                   PT Short Term Goals - 12/19/16 1344      PT SHORT TERM GOAL #1   Title Pt  right kneeROM to be at 0 to allow more normalized gait    Time 1   Period Weeks   Status New     PT SHORT TERM GOAL #2   Title Pt Rt knee flexion to be to 115 to allow pt to bend down to pick items off of the ground    Time 2   Period Weeks   Status New     PT SHORT TERM GOAL #3   Title PT right knee pain to be no greater than a 3/10 to decrease on pain medication needed to sleep    Time 2   Period Weeks   Status New           PT Long Term Goals - 12/19/16 1345      PT LONG TERM GOAL #1   Title Pt to be able to go up and down a flight of steps in  reciprocal manner    Time 3   Period Weeks   Status New     PT LONG TERM GOAL #2   Title Pt to be able to walk for an hour without increased pain to get back to hiking    Time 4   Period Weeks   Status New     PT LONG TERM GOAL #3   Title Pt right knee pain to be at the most a 1/10 to be able to sleep without pain   Time 4   Period Weeks   Status New     PT LONG TERM GOAL #4   Title Pt to be able to return to playing golf without right knee pain    Time 4   Period Weeks   Status New               Plan - 01/04/17 1339    Clinical Impression Statement Continued with isolated strengthening and quads activation today. No aggressive ROM persued in the knee due to contniued pain associated with this from last session 2DA. Gait remains symmetrical and smooth and speed consistent. Pt c/o pain in bial thips with sustained gait, but palpation of deep hip muscularture vreveals no abnormality or pain response. Pt gives limited feedback on pain during session, giving concern for difficulty in appropriately increasing activities.  Quad strength is improviing. ROM remains limited, and patient demonstrate increased joint effusion this session. Making good progress towad goals overall.    Rehab Potential Good   PT Frequency 2x / week   PT Duration 4 weeks   PT Treatment/Interventions ADLs/Self Care Home Management;Manual techniques;Patient/family education;Therapeutic activities;Stair training;Gait training;Therapeutic exercise;Balance training   PT Next Visit Plan progress strength: quads hamstrings, and hip stabilizers. Progress static baalnce and increase dynamic balance. All ROM work should be light and gentle at this time as patient has poor long term resposne to this with increased edema.    PT Home Exercise Plan Q-set, heelslides, active hamstring stretch and bridging.    Consulted and Agree with Plan of Care Patient      Patient will benefit from skilled therapeutic intervention in  order to improve the following deficits and impairments:  Abnormal gait, Decreased activity tolerance, Decreased balance, Decreased range of motion, Decreased strength, Increased edema, Pain  Visit Diagnosis: Acute pain of right knee  Stiffness of right knee, not elsewhere classified  Muscle weakness (generalized)     Problem List Patient Active Problem List   Diagnosis Date Noted  . Painful orthopaedic hardware ([place]) 01/07/2015  . S/P lumbar fusion   . HTN (hypertension) 10/14/2014  . Altered mental status 10/14/2014  . Fever 10/14/2014  . Lumbar pseudoarthrosis 10/11/2014  . Muscle wasting and atrophy, NEC, unsp lower leg 06/04/2013  . Weakness of left leg 06/04/2013  . Left knee pain 06/04/2013   1:46 PM, 01/04/17 Etta Grandchild, PT, DPT Physical Therapist at [place] [phone] (office)      Etta Grandchild 01/04/2017, 1:45 PM  [place] [address] [place], [place], [address] Phone: [phone]   Fax:  [phone]  Name: Austin Calderon MRN: [id number] Date of Birth: Sep 22, 1955

## 2017-01-09 ENCOUNTER — Encounter (HOSPITAL_COMMUNITY): Payer: Self-pay

## 2017-01-09 ENCOUNTER — Ambulatory Visit (HOSPITAL_COMMUNITY): Payer: BLUE CROSS/BLUE SHIELD | Attending: Orthopedic Surgery

## 2017-01-09 DIAGNOSIS — M25561 Pain in right knee: Secondary | ICD-10-CM | POA: Diagnosis not present

## 2017-01-09 DIAGNOSIS — M25661 Stiffness of right knee, not elsewhere classified: Secondary | ICD-10-CM | POA: Diagnosis present

## 2017-01-09 DIAGNOSIS — M6281 Muscle weakness (generalized): Secondary | ICD-10-CM | POA: Insufficient documentation

## 2017-01-09 NOTE — Therapy (Signed)
[place], [place], [address] Phone: [phone]   Fax:  [phone]  Physical Therapy Treatment  Patient Details  Name: Austin Calderon MRN: [id number] Date of Birth: August 03, 1956 Referring Provider: Elsie Saas  Encounter Date: 01/09/2017      PT End of Session - 01/09/17 1305    Visit Number 6   Number of Visits 8   Date for PT Re-Evaluation 01/18/17   Authorization Type BCBS   Authorization - Visit Number 6   Authorization - Number of Visits 8   PT Start Time 1301   PT Stop Time 1341   PT Time Calculation (min) 40 min   Equipment Utilized During Treatment Gait belt   Activity Tolerance Patient tolerated treatment well;No increased pain;Patient limited by fatigue   Behavior During Therapy [facility] for tasks assessed/performed      Past Medical History:  Diagnosis Date  . Chronic back pain   . Family history of adverse reaction to anesthesia    sister gets sick with anesthesia  . GERD (gastroesophageal reflux disease)    hx of-was on meds yrs ago  . History of gastric ulcer   . HTN (hypertension)    was on Norvasc but has been off for several months  . Nocturia   . RBBB (right bundle branch block)   . Weakness    both legs occasionally    Past Surgical History:  Procedure Laterality Date  . BACK SURGERY    . ESOPHAGOGASTRODUODENOSCOPY    . HARDWARE REMOVAL Left 01/07/2015   Procedure: HARDWARE REMOVAL LEFT L4-5 PEDICLE SCREWS;  Surgeon: Earnie Larsson, MD;  Location: [place] [facility];  Service: Neurosurgery;  Laterality: Left;  HARDWARE REMOVAL LEFT L4-5 PEDICLE SCREWS  . KNEE ARTHROSCOPY Right   . LUMBAR FUSION    . SPINAL FUSION      There were no vitals filed for this visit.      Subjective Assessment - 01/09/17 1302    Subjective Pt states that his R knee is hurting today. He states "I don't know if this is going to do much good" referring to PT overall.    Pertinent History HTN   Currently in  Pain? Yes   Pain Score 3    Pain Location Knee   Pain Orientation Right   Pain Descriptors / Indicators Nagging   Pain Type Surgical pain   Aggravating Factors  putting weight on it first thing in the morning   Pain Relieving Factors ice, moving around   Effect of Pain on Daily Activities increases            [facility] Adult PT Treatment/Exercise - 01/09/17 0001      Knee/Hip Exercises: Standing   Wall Squat 5 reps   Wall Squat Limitations 10 sec holds   SLS bil SLS on airex 3 x 30 sec each; bil SLS and push-press with 5# weight bar 3 x 5 each   Other Standing Knee Exercises bil side stepping with GTB 76ft x 3 laps     Knee/Hip Exercises: Seated   Long Arc Quad Strengthening;Right;2 sets;10 reps   Long Arc Quad Weight 3 lbs.   Long [employer] Limitations 3 sec hold at top   Stool Scoot - Round Trips fwd to work HS, 32ft x 3 laps     Knee/Hip Exercises: Supine   Bridges Both;2 sets;15 reps   Bridges Limitations with GTB around knees     Knee/Hip Exercises: Prone  Hamstring Curl 2 sets;10 reps   Hamstring Curl Limitations 3#     Manual Therapy   Manual Therapy Soft tissue mobilization;Edema management   Manual therapy comments manual completed separate rest of treatment   Edema Management Retro massage with LE elevated for edema control   Soft tissue mobilization efflurage to distal VMO, pt had 1 episode of sharp pain to palpation but it improved with prolonged manual techniques              PT Short Term Goals - 12/19/16 1344      PT SHORT TERM GOAL #1   Title Pt  right kneeROM to be at 0 to allow more normalized gait    Time 1   Period Weeks   Status New     PT SHORT TERM GOAL #2   Title Pt Rt knee flexion to be to 115 to allow pt to bend down to pick items off of the ground    Time 2   Period Weeks   Status New     PT SHORT TERM GOAL #3   Title PT right knee pain to be no greater than a 3/10 to decrease on pain medication needed to sleep    Time 2    Period Weeks   Status New           PT Long Term Goals - 12/19/16 1345      PT LONG TERM GOAL #1   Title Pt to be able to go up and down a flight of steps in reciprocal manner    Time 3   Period Weeks   Status New     PT LONG TERM GOAL #2   Title Pt to be able to walk for an hour without increased pain to get back to hiking    Time 4   Period Weeks   Status New     PT LONG TERM GOAL #3   Title Pt right knee pain to be at the most a 1/10 to be able to sleep without pain   Time 4   Period Weeks   Status New     PT LONG TERM GOAL #4   Title Pt to be able to return to playing golf without right knee pain    Time 4   Period Weeks   Status New               Plan - 01/09/17 1343    Clinical Impression Statement Began session with manual therapy to address swelling that was present in R knee. Rest of session focused on strengthening of quads, HS, and glutes per POC. Pt tolerated well but he did demo fatigue throughout. Pt's dynamic hip stability continues to be deficient as evidenced by his difficulty with SLS on airex with UE movements.    Rehab Potential Good   PT Frequency 2x / week   PT Duration 4 weeks   PT Treatment/Interventions ADLs/Self Care Home Management;Manual techniques;Patient/family education;Therapeutic activities;Stair training;Gait training;Therapeutic exercise;Balance training   PT Next Visit Plan continue to progress strength: quads hamstrings, and hip stabilizers. Progress static baalnce and increase dynamic balance. All ROM work should be light and gentle at this time as patient has poor long term response to this with increased edema; continue stool scoots and LAQs for HS and quad strengthening   PT Home Exercise Plan Q-set, heelslides, active hamstring stretch and bridging.    Consulted and Agree with Plan of Care Patient  Patient will benefit from skilled therapeutic intervention in order to improve the following deficits and impairments:   Abnormal gait, Decreased activity tolerance, Decreased balance, Decreased range of motion, Decreased strength, Increased edema, Pain  Visit Diagnosis: Acute pain of right knee  Stiffness of right knee, not elsewhere classified  Muscle weakness (generalized)     Problem List Patient Active Problem List   Diagnosis Date Noted  . Painful orthopaedic hardware ([place]) 01/07/2015  . S/P lumbar fusion   . HTN (hypertension) 10/14/2014  . Altered mental status 10/14/2014  . Fever 10/14/2014  . Lumbar pseudoarthrosis 10/11/2014  . Muscle wasting and atrophy, NEC, unsp lower leg 06/04/2013  . Weakness of left leg 06/04/2013  . Left knee pain 06/04/2013     Geraldine Solar PT, DPT   [facility] [address] [place], [place], [address] Phone: [phone]   Fax:  [phone]  Name: JEN EPPINGER MRN: [id number] Date of Birth: 07-27-1956

## 2017-01-11 ENCOUNTER — Ambulatory Visit (HOSPITAL_COMMUNITY): Payer: BLUE CROSS/BLUE SHIELD

## 2017-01-16 ENCOUNTER — Ambulatory Visit (HOSPITAL_COMMUNITY): Payer: BLUE CROSS/BLUE SHIELD | Admitting: Physical Therapy

## 2017-01-16 DIAGNOSIS — M6281 Muscle weakness (generalized): Secondary | ICD-10-CM

## 2017-01-16 DIAGNOSIS — M25561 Pain in right knee: Secondary | ICD-10-CM

## 2017-01-16 DIAGNOSIS — M25661 Stiffness of right knee, not elsewhere classified: Secondary | ICD-10-CM

## 2017-01-16 NOTE — Therapy (Signed)
[place], [place], [address] Phone: [phone]   Fax:  [phone]  Physical Therapy Treatment  Patient Details  Name: Austin Calderon MRN: [id number] Date of Birth: [birth year] Referring Provider: Elsie Saas   Encounter Date: 01/16/2017      PT End of Session - 01/16/17 1338    Visit Number 7   Number of Visits 8   Date for PT Re-Evaluation 01/18/17   Authorization Type BCBS   Authorization - Visit Number 7   Authorization - Number of Visits 8   PT Start Time 7858   PT Stop Time 1342   PT Time Calculation (min) 39 min   Equipment Utilized During Treatment Gait belt   Activity Tolerance Patient tolerated treatment well;No increased pain;Patient limited by fatigue   Behavior During Therapy [facility] for tasks assessed/performed      Past Medical History:  Diagnosis Date  . Chronic back pain   . Family history of adverse reaction to anesthesia    sister gets sick with anesthesia  . GERD (gastroesophageal reflux disease)    hx of-was on meds yrs ago  . History of gastric ulcer   . HTN (hypertension)    was on Norvasc but has been off for several months  . Nocturia   . RBBB (right bundle branch block)   . Weakness    both legs occasionally    Past Surgical History:  Procedure Laterality Date  . BACK SURGERY    . ESOPHAGOGASTRODUODENOSCOPY    . HARDWARE REMOVAL Left 01/07/2015   Procedure: HARDWARE REMOVAL LEFT L4-5 PEDICLE SCREWS;  Surgeon: Earnie Larsson, MD;  Location: [place] [facility];  Service: Neurosurgery;  Laterality: Left;  HARDWARE REMOVAL LEFT L4-5 PEDICLE SCREWS  . KNEE ARTHROSCOPY Right   . LUMBAR FUSION    . SPINAL FUSION      There were no vitals filed for this visit.      Subjective Assessment - 01/16/17 1311    Subjective Pt states that he can tell that he is still has a lot of fluid in his knee.  Feels that it needs to be drained off    Pertinent History HTN   How long can you sit  comfortably? no problem   How long can you stand comfortably? a couple of hours was less than five minutes    How long can you walk comfortably? a couple of hours was less than five minutes    Currently in Pain? No/denies  hip pain only now; hip pain will go as high as a 10/10    Pain Score 0-No pain            [facility] PT Assessment - 01/16/17 0001      Assessment   Medical Diagnosis Rt arthroscopic surgery    Referring Provider Elsie Saas    Onset Date/Surgical Date 12/01/16  approximate   Next MD Visit 12/27/2016   Prior Therapy none     Precautions   Precautions None     Restrictions   Weight Bearing Restrictions No     Home Environment   Living Environment Private residence   Home Access Stairs to enter   Entrance Stairs-Number of Steps 3  unable to go down reciprocal      Prior Function   Level of Independence Independent   Vocation Full time employment   Vocation Requirements mainly sitting  but occasionally will lift 30# at waist height    Leisure golf,  hike, garden     Cognition   Overall Cognitive Status Within Functional Limits for tasks assessed     Observation/Other Assessments   Focus on Therapeutic Outcomes (FOTO)  55     Observation/Other Assessments-Edema    Edema --  popliteal crease: Rt 46.5; LT 40.5     Functional Tests   Functional tests Single leg stance;Sit to Stand     Single Leg Stance   Comments Lt:  60;  RT 60  was Rt 30      Sit to Stand   Comments --     AROM   Right Knee Extension 0  was 3    Right Knee Flexion 125  was100     Strength   Right Hip Flexion 5/5   Right Hip Extension 5/5  was 4/5    Right Hip ABduction 5/5   Right Knee Flexion 5/5  was 4/5    Right Knee Extension 5/5   Left Knee Flexion 5/5   Right Ankle Dorsiflexion 5/5                     [facility] Adult PT Treatment/Exercise - 01/16/17 0001      Knee/Hip Exercises: Stretches   Other Knee/Hip Stretches back extension to strech  anterior aspect of hip x 5    Other Knee/Hip Stretches slant board 1' x 2      Knee/Hip Exercises: Aerobic   Tread Mill 1.5 mph x 5' to address normalizing gait      Knee/Hip Exercises: Standing   Forward Step Up Right;15 reps   Step Down Both;10 reps;Hand Hold: 1   Other Standing Knee Exercises bil side stepping with GTB 42f x 3 laps   Other Standing Knee Exercises hitting balled up paper with cane to simulate golf swing      Knee/Hip Exercises: Seated   Stool Scoot - Round Trips fwd to work HS, 185fx 3 laps   Sit to [employer]5 reps                  PT Short Term Goals - 01/16/17 1313      PT SHORT TERM GOAL #1   Title Pt  right kneeROM to be at 0 to allow more normalized gait    Time 1   Period Weeks   Status Achieved     PT SHORT TERM GOAL #2   Title Pt Rt knee flexion to be to 115 to allow pt to bend down to pick items off of the ground    Time 2   Period Weeks   Status Achieved     PT SHORT TERM GOAL #3   Title PT right knee pain to be no greater than a 3/10 to decrease on pain medication needed to sleep    Time 2   Period Weeks   Status Achieved           PT Long Term Goals - 01/16/17 1313      PT LONG TERM GOAL #1   Title Pt to be able to go up and down a flight of steps in reciprocal manner    Baseline --  able to go up fine but down he still has difficulty    Time 3   Period Weeks   Status Partially Met     PT LONG TERM GOAL #2   Title Pt to be able to walk for an hour without increased pain to get back to  hiking    Time 4   Period Weeks   Status Achieved     PT LONG TERM GOAL #3   Title Pt right knee pain to be at the most a 1/10 to be able to sleep without pain   Time 4   Period Weeks   Status Achieved     PT LONG TERM GOAL #4   Title Pt to be able to return to playing golf without right knee pain    Time 4   Period Weeks   Status Not Met  has not tried due to not MD not clearing; also pt stopped due to back pain not due to  knee pain                 Plan - 01/16/17 1339    Clinical Impression Statement Pt reassessed with no balance, or ROM deficits.  Manual muscle testing is normal but pt still has decreased eccentric strength.  PT treatment focused on improving eccentric strength and mimicing golf swing.  Only after approximately 10 golf swings did pt state that not playing golf was not due to his knee pain but due to his back pain.    Rehab Potential Good   PT Frequency 2x / week   PT Duration 4 weeks   PT Treatment/Interventions ADLs/Self Care Home Management;Manual techniques;Patient/family education;Therapeutic activities;Stair training;Gait training;Therapeutic exercise;Balance training   PT Next Visit Plan Complete foto, give eccentric HEP as next visit will be patient's last may use mm and ROM from todays session to sent to MD    PT Home Exercise Plan Q-set, heelslides, active hamstring stretch and bridging.    Consulted and Agree with Plan of Care Patient      Patient will benefit from skilled therapeutic intervention in order to improve the following deficits and impairments:  Abnormal gait, Decreased activity tolerance, Decreased balance, Decreased range of motion, Decreased strength, Increased edema, Pain  Visit Diagnosis: Acute pain of right knee  Stiffness of right knee, not elsewhere classified  Muscle weakness (generalized)     Problem List Patient Active Problem List   Diagnosis Date Noted  . Painful orthopaedic hardware ([place]) 01/07/2015  . S/P lumbar fusion   . HTN (hypertension) 10/14/2014  . Altered mental status 10/14/2014  . Fever 10/14/2014  . Lumbar pseudoarthrosis 10/11/2014  . Muscle wasting and atrophy, NEC, unsp lower leg 06/04/2013  . Weakness of left leg 06/04/2013  . Left knee pain 06/04/2013    Rayetta Humphrey, PT CLT [phone] 01/16/2017, 1:43 PM  [place] [address] [place], [place], [address] Phone:  [phone]   Fax:  [phone]  Name: Austin Calderon MRN: [id number] Date of Birth: [birth year]

## 2017-01-18 ENCOUNTER — Ambulatory Visit (HOSPITAL_COMMUNITY): Payer: BLUE CROSS/BLUE SHIELD

## 2017-01-18 ENCOUNTER — Encounter (HOSPITAL_COMMUNITY): Payer: Self-pay

## 2017-01-18 DIAGNOSIS — M25661 Stiffness of right knee, not elsewhere classified: Secondary | ICD-10-CM

## 2017-01-18 DIAGNOSIS — M25561 Pain in right knee: Secondary | ICD-10-CM

## 2017-01-18 DIAGNOSIS — M6281 Muscle weakness (generalized): Secondary | ICD-10-CM

## 2017-01-18 NOTE — Patient Instructions (Addendum)
  SIT TO STAND - NO SUPPORT  Start by scooting close to the front of the chair.  Next, lean forward at your trunk and reach forward with your arms and rise to standing without using your hands to push off from the chair or other object.   Use your arms as a counter-balance by reaching forward when in sitting and lower them as you approach standing.   Do 2-3 sets of 10 reps and take 5 seconds to lower yourself back down into the chair.   STEP DOWN - LATERAL - UNSTABLE  Start with both feet on top of a step/box and on top of an unstable surface such as a foam pad. Next, slowly lower the unaffected leg off the side of the step/box to lightly touch the heel to the floor. Then return to the original position with both feet on the step/box.   Maintain proper knee alignment: Knee in line with the 2nd toe and not passing in front of the toes.  Take 3 seconds to lower yourself down.   Doe 2-3 sets of 10 reps    Wall sits  Stand against wall with feet about a foot in front of you and hip width apart.  Slowly bend your knees until you come to a position that you can hold for up to a minute.  Pull you belly into your spine, press your shoulders into the wall.  Push yourself away from the wall to come out of exercise, as opposed to sliding up the wall  Do 2-3 sets of 10 reps holding for 5-10 seconds each.   LUNGE  Start by standing with feet shoulder-width-apart. Next, take a step forward and allow your front knee to bend. Your back knee may bend as well. Then, return to original position, or you may walk and take a step forward and repeat with the other leg.  Keep your pelvis level and straight the entire time. Make sure to go straight down and not forward.   Your front knee should bend in line with the 2nd toe and not pass the front of the foot.  Dot 2-3 sets of 5-10 reps on each leg.

## 2017-01-18 NOTE — Therapy (Signed)
[facility] [address] [place], [place], [address] Phone: [phone]   Fax:  [phone]  Physical Therapy Treatment/Discharge Summary  Patient Details  Name: Austin Calderon MRN: [id number] Date of Birth: Oct 05, 1955 Referring Provider: Elsie Saas   Encounter Date: 01/18/2017      PT End of Session - 01/18/17 1305    Visit Number 8   Number of Visits 8   Date for PT Re-Evaluation 01/18/17   Authorization Type BC[employer]   Authorization - Visit Number 8   Authorization - Number of Visits 8   PT Start Time 1300   PT Stop Time 1318   PT Time Calculation (min) 18 min   Equipment Utilized During Treatment --   Activity Tolerance Patient tolerated treatment well;No increased pain   Behavior During Therapy WFL for tasks assessed/performed      Past Medical History:  Diagnosis Date  . Chronic back pain   . Family history of adverse reaction to anesthesia    sister gets sick with anesthesia  . GERD (gastroesophageal reflux disease)    hx of-was on meds yrs ago  . History of gastric ulcer   . HTN (hypertension)    was on Norvasc but has been off for several months  . Nocturia   . RBBB (right bundle branch block)   . Weakness    both legs occasionally    Past Surgical History:  Procedure Laterality Date  . BACK SURGERY    . ESOPHAGOGASTRODUODENOSCOPY    . HARDWARE REMOVAL Left 01/07/2015   Procedure: HARDWARE REMOVAL LEFT L4-5 PEDICLE SCREWS;  Surgeon: Earnie Larsson, MD;  Location: [facility];  Service: Neurosurgery;  Laterality: Left;  HARDWARE REMOVAL LEFT L4-5 PEDICLE SCREWS  . KNEE ARTHROSCOPY Right   . LUMBAR FUSION    . SPINAL FUSION      There were no vitals filed for this visit.      Subjective Assessment - 01/18/17 1305    Subjective Pt states that he feels good today. He denies any pain but states that he still has fluid and is getting it drained next Tuesday.   Pertinent History HTN   How long can you sit  comfortably? no problem   How long can you stand comfortably? a couple of hours was less than five minutes    How long can you walk comfortably? a couple of hours was less than five minutes    Currently in Pain? No/denies            [facility] PT Assessment - 01/18/17 0001      Observation/Other Assessments-Edema    Edema --  popliteal crease: Rt 46.5; LT 40.5     AROM   Right Knee Extension 0   Right Knee Flexion 125     Strength   Right Hip Flexion 5/5   Right Hip Extension 5/5   Right Hip ABduction 5/5   Right Knee Flexion 5/5   Right Knee Extension 5/5   Left Knee Flexion 5/5   Right Ankle Dorsiflexion 5/5              [facility] Adult PT Treatment/Exercise - 01/18/17 0001      Knee/Hip Exercises: Standing   Forward Lunges Both;1 set;5 reps   Forward Lunges Limitations min cueing for proper technique   Step Down Right;1 set;15 reps;Hand Hold: 0;Step Height: 6"  eccentric x 2-3 sec lowering   Stairs x 3 RT with no UE support and reciprocal pattern  BLE     Knee/Hip Exercises: Seated   Sit to Sand 10 reps  eccentric (x5 seconds to lower)              PT Education - 01/18/17 1328    Education provided Yes   Education Details updated HEP to include eccentric strengthening; discharge plans, can return with referral   Person(s) Educated Patient   Methods Explanation;Demonstration   Comprehension Verbalized understanding;Returned demonstration          PT Short Term Goals - 01/18/17 1333      PT SHORT TERM GOAL #1   Title Pt  right kneeROM to be at 0 to allow more normalized gait    Time 1   Period Weeks   Status Achieved     PT SHORT TERM GOAL #2   Title Pt Rt knee flexion to be to 115 to allow pt to bend down to pick items off of the ground    Time 2   Period Weeks   Status Achieved     PT SHORT TERM GOAL #3   Title PT right knee pain to be no greater than a 3/10 to decrease on pain medication needed to sleep    Time 2   Period Weeks   Status  Achieved           PT Long Term Goals - 01/18/17 1333      PT LONG TERM GOAL #1   Title Pt to be able to go up and down a flight of steps in reciprocal manner    Baseline pt demo'd good control with ascent and descent this day    Time 3   Period Weeks   Status Achieved     PT LONG TERM GOAL #2   Title Pt to be able to walk for an hour without increased pain to get back to hiking    Time 4   Period Weeks   Status Achieved     PT LONG TERM GOAL #3   Title Pt right knee pain to be at the most a 1/10 to be able to sleep without pain   Time 4   Period Weeks   Status Achieved     PT LONG TERM GOAL #4   Title Pt to be able to return to playing golf without right knee pain    Time 4   Period Weeks   Status Not Met  has not tried due to not MD not clearing                Plan - 01/18/17 1329    Clinical Impression Statement PT performed FOTO with pt which generated a result of 68%. Pt verbalized that he had improved a lot since beginning therapy but that he wasn't back to 100% of PLOF. He states that his R knee does not keep him from doing everything he wants or needs to do. He was noted to still have increased fluid on the R knee, but he has an appointment scheduled for next Tuesday to get the fluid drained off and feels that will make "a world of difference" in his function. Pt given updated HEP to include more eccentric control and functional strengthening. Pt verbalized and demo'd understanding of exercises and was agreeable to d/c plan.    Rehab Potential Good   PT Frequency 2x / week   PT Duration 4 weeks   PT Treatment/Interventions ADLs/Self Care Home Management;Manual techniques;Patient/family education;Therapeutic activities;Stair training;Gait training;Therapeutic exercise;Balance  training   PT Next Visit Plan Complete foto, give eccentric HEP as next visit will be patient's last may use mm and ROM from todays session to sent to MD    PT Home Exercise Plan Q-set,  heelslides, active hamstring stretch and bridging. 5/11: eccentric sit <> stands, eccentric lateral step downs, wall sits, fwd lunging   Consulted and Agree with Plan of Care Patient      Patient will benefit from skilled therapeutic intervention in order to improve the following deficits and impairments:  Abnormal gait, Decreased activity tolerance, Decreased balance, Decreased range of motion, Decreased strength, Increased edema, Pain  Visit Diagnosis: Acute pain of right knee  Stiffness of right knee, not elsewhere classified  Muscle weakness (generalized)     Problem List Patient Active Problem List   Diagnosis Date Noted  . Painful orthopaedic hardware ([place]) 01/07/2015  . S/P lumbar fusion   . HTN (hypertension) 10/14/2014  . Altered mental status 10/14/2014  . Fever 10/14/2014  . Lumbar pseudoarthrosis 10/11/2014  . Muscle wasting and atrophy, NEC, unsp lower leg 06/04/2013  . Weakness of left leg 06/04/2013  . Left knee pain 06/04/2013    PHYSICAL THERAPY DISCHARGE SUMMARY  Visits from Start of Care:   Current functional level related to goals / functional outcomes: See clinical impression and objective measures above.   Remaining deficits: Pt still has swelling in knee and minor eccentric control deficits   Education / Equipment: Updated HEP Plan: Patient agrees to discharge.  Patient goals were partially met. Patient is being discharged due to meeting the stated rehab goals.  ?????       Geraldine Solar PT, [facility] [address] [place], [place], [address] Phone: [phone]   Fax:  [phone]  Name: BETHANY CUMMING MRN: [id number] Date of Birth: 06/10/1956

## 2017-03-06 ENCOUNTER — Other Ambulatory Visit (HOSPITAL_COMMUNITY): Payer: Self-pay | Admitting: Family Medicine

## 2017-03-06 DIAGNOSIS — R220 Localized swelling, mass and lump, head: Secondary | ICD-10-CM

## 2017-03-18 ENCOUNTER — Other Ambulatory Visit (HOSPITAL_COMMUNITY): Payer: Self-pay | Admitting: Family Medicine

## 2017-03-18 DIAGNOSIS — R221 Localized swelling, mass and lump, neck: Secondary | ICD-10-CM

## 2017-03-18 DIAGNOSIS — R220 Localized swelling, mass and lump, head: Secondary | ICD-10-CM

## 2017-03-26 ENCOUNTER — Ambulatory Visit (HOSPITAL_COMMUNITY)
Admission: RE | Admit: 2017-03-26 | Discharge: 2017-03-26 | Disposition: A | Discharge status: Home / Self Care | Payer: BLUE CROSS/BLUE SHIELD | Source: Ambulatory Visit | Attending: Family Medicine | Admitting: Family Medicine

## 2017-03-26 DIAGNOSIS — R221 Localized swelling, mass and lump, neck: Secondary | ICD-10-CM

## 2017-03-26 DIAGNOSIS — R220 Localized swelling, mass and lump, head: Secondary | ICD-10-CM | POA: Insufficient documentation

## 2017-03-26 DIAGNOSIS — E278 Other specified disorders of adrenal gland: Secondary | ICD-10-CM | POA: Diagnosis not present

## 2017-11-12 ENCOUNTER — Other Ambulatory Visit: Payer: Self-pay | Admitting: Orthopedic Surgery

## 2017-11-12 DIAGNOSIS — M25552 Pain in left hip: Secondary | ICD-10-CM

## 2017-11-20 ENCOUNTER — Other Ambulatory Visit: Payer: BLUE CROSS/BLUE SHIELD

## 2019-10-09 ENCOUNTER — Ambulatory Visit: Payer: BC Managed Care – PPO | Attending: Internal Medicine

## 2019-10-09 ENCOUNTER — Other Ambulatory Visit: Payer: Self-pay

## 2019-10-09 DIAGNOSIS — Z20822 Contact with and (suspected) exposure to covid-19: Secondary | ICD-10-CM

## 2019-10-10 ENCOUNTER — Telehealth: Payer: Self-pay | Admitting: Physician Assistant

## 2019-10-10 LAB — NOVEL CORONAVIRUS, NAA: SARS-CoV-2, NAA: DETECTED — AB

## 2019-10-10 NOTE — Telephone Encounter (Signed)
Called to discuss with Deborra Medina about Covid symptoms and the use of bamlanivimab or casirivimab/imdevimab, a monoclonal antibody infusion for those with mild to moderate Covid symptoms and at a high risk of hospitalization.     Pt is qualified for this infusion at the [facility] infusion center due to co-morbid conditions and/or a member of an at-risk group, however declines infusion at this time. Symptoms tier reviewed as well as criteria for ending isolation.  Symptoms reviewed that would warrant ED/Hospital evaluation. Preventative practices reviewed. Patient verbalized understanding. Patient advised to call back if he decides that he does want to get infusion. Callback number to the infusion center given. Patient advised to go to Urgent care or ED with severe symptoms. Last date pt would be eligible for infusion is 10/15/19.    Patient Active Problem List   Diagnosis Date Noted  . Painful orthopaedic hardware ([place]) 01/07/2015  . S/P lumbar fusion   . HTN (hypertension) 10/14/2014  . Altered mental status 10/14/2014  . Fever 10/14/2014  . Lumbar pseudoarthrosis 10/11/2014  . Muscle wasting and atrophy, NEC, unsp lower leg 06/04/2013  . Weakness of left leg 06/04/2013  . Left knee pain 06/04/2013    Angelena Form PA-C

## 2019-10-19 ENCOUNTER — Other Ambulatory Visit: Payer: Self-pay

## 2019-10-19 ENCOUNTER — Ambulatory Visit: Payer: BC Managed Care – PPO | Attending: Internal Medicine

## 2019-10-19 DIAGNOSIS — Z20822 Contact with and (suspected) exposure to covid-19: Secondary | ICD-10-CM

## 2019-10-20 LAB — NOVEL CORONAVIRUS, NAA: SARS-CoV-2, NAA: NOT DETECTED

## 2019-10-21 ENCOUNTER — Other Ambulatory Visit: Payer: BC Managed Care – PPO

## 2019-10-26 ENCOUNTER — Other Ambulatory Visit: Payer: Self-pay

## 2019-10-26 ENCOUNTER — Ambulatory Visit: Payer: BC Managed Care – PPO | Attending: Internal Medicine

## 2019-10-26 DIAGNOSIS — Z20822 Contact with and (suspected) exposure to covid-19: Secondary | ICD-10-CM

## 2019-10-27 LAB — NOVEL CORONAVIRUS, NAA: SARS-CoV-2, NAA: NOT DETECTED

## 2020-05-16 ENCOUNTER — Ambulatory Visit
Admission: EM | Admit: 2020-05-16 | Discharge: 2020-05-16 | Disposition: A | Discharge status: Home / Self Care | Payer: 59

## 2020-05-16 ENCOUNTER — Other Ambulatory Visit: Payer: Self-pay

## 2020-05-16 DIAGNOSIS — H60392 Other infective otitis externa, left ear: Secondary | ICD-10-CM | POA: Diagnosis not present

## 2020-05-16 MED ORDER — NEOMYCIN-POLYMYXIN-HC 3.5-10000-1 OT SUSP: 4.0000 [drp] | Freq: Three times a day (TID) | OTIC | 0 refills | Status: DC

## 2020-05-16 NOTE — Discharge Instructions (Addendum)
Rest and drink plenty of fluids Prescribed Corticosporin Take medications as directed and to completion Continue to use OTC ibuprofen and/ or tylenol as needed for pain control Follow up with PCP if symptoms persists Return here or go to the ER if you have any new or worsening symptoms

## 2020-05-16 NOTE — ED Triage Notes (Signed)
Pt presents with left ear pain that began last night

## 2020-05-16 NOTE — ED Provider Notes (Signed)
[facility]   [id number] 05/16/20 Arrival Time: 4944  Chief Complaint  Patient presents with  . Otalgia     SUBJECTIVE: History from: patient.  Austin Calderon is a 64 y.o. male who presents to the urgent care for complaint of left ear pain that started last night.  Denies a precipitating event, such as swimming or wearing ear plugs.  Patient states the pain is constant and achy in character.  Patient has tried OTC medication without relief.  Symptoms are made worse with lying down.  Denies similar symptoms in the past.  Denies fever, chills, fatigue, sinus pain, rhinorrhea, ear discharge, sore throat, SOB, wheezing, chest pain, nausea, changes in bowel or bladder habits.    ROS: As per HPI.  All other pertinent ROS negative.     Past Medical History:  Diagnosis Date  . Chronic back pain   . Family history of adverse reaction to anesthesia    sister gets sick with anesthesia  . GERD (gastroesophageal reflux disease)    hx of-was on meds yrs ago  . History of gastric ulcer   . HTN (hypertension)    was on Norvasc but has been off for several months  . Nocturia   . RBBB (right bundle branch block)   . Weakness    both legs occasionally   Past Surgical History:  Procedure Laterality Date  . BACK SURGERY    . ESOPHAGOGASTRODUODENOSCOPY    . HARDWARE REMOVAL Left 01/07/2015   Procedure: HARDWARE REMOVAL LEFT L4-5 PEDICLE SCREWS;  Surgeon: Earnie Larsson, MD;  Location: [facility];  Service: Neurosurgery;  Laterality: Left;  HARDWARE REMOVAL LEFT L4-5 PEDICLE SCREWS  . KNEE ARTHROSCOPY Right   . LUMBAR FUSION    . SPINAL FUSION     Allergies  Allergen Reactions  . Valium [Diazepam] Other (See Comments)    "makes me crazy"   No current facility-administered medications on file prior to encounter.   Current Outpatient Medications on File Prior to Encounter  Medication Sig Dispense Refill  . amLODipine (NORVASC) 10 MG tablet Take 10 mg by mouth daily.    .  carvedilol (COREG) 3.125 MG tablet Take 1 tablet (3.125 mg total) by mouth 2 (two) times daily with a meal. 60 tablet 3  . cyclobenzaprine (FEXMID) 7.5 MG tablet Take 1 tablet (7.5 mg total) by mouth 2 (two) times daily as needed for muscle spasms. 30 tablet 1  . diphenhydramine-acetaminophen (TYLENOL PM) 25-500 MG TABS Take 1 tablet by mouth at bedtime as needed (sleep/pain).    . mirtazapine (REMERON) 30 MG tablet Take 30 mg by mouth at bedtime.    Marland Kitchen oxyCODONE-acetaminophen (PERCOCET) 10-325 MG per tablet Take 1-2 tablets by mouth every 4 (four) hours as needed for pain. 80 tablet 0   Social History   Socioeconomic History  . Marital status: Married    Spouse name: Not on file  . Number of children: Not on file  . Years of education: Not on file  . Highest education level: Not on file  Occupational History  . Not on file  Tobacco Use  . Smoking status: Former Research scientist (life sciences)  . Smokeless tobacco: Never Used  . Tobacco comment: quit smoking in Mar 2015  Substance and Sexual Activity  . Alcohol use: Yes    Comment: 4-5 beers daily  . Drug use: No  . Sexual activity: Yes  Other Topics Concern  . Not on file  Social History Narrative  . Not on file  Social Determinants of Health   Financial Resource Strain:   . Difficulty of Paying Living Expenses: Not on file  Food Insecurity:   . Worried About Charity fundraiser in the Last Year: Not on file  . Ran Out of Food in the Last Year: Not on file  Transportation Needs:   . Lack of Transportation (Medical): Not on file  . Lack of Transportation (Non-Medical): Not on file  Physical Activity:   . Days of Exercise per Week: Not on file  . Minutes of Exercise per Session: Not on file  Stress:   . Feeling of Stress : Not on file  Social Connections:   . Frequency of Communication with Friends and Family: Not on file  . Frequency of Social Gatherings with Friends and Family: Not on file  . Attends Religious Services: Not on file  . Active  Member of Clubs or Organizations: Not on file  . Attends Archivist Meetings: Not on file  . Marital Status: Not on file  Intimate Partner Violence:   . Fear of Current or Ex-Partner: Not on file  . Emotionally Abused: Not on file  . Physically Abused: Not on file  . Sexually Abused: Not on file   Family History  Problem Relation Age of Onset  . Breast cancer Mother     OBJECTIVE:  Vitals:   05/16/20 1347  BP: (!) 186/99  Pulse: 86  Resp: 20  Temp: 98.3 F (36.8 C)  SpO2: 96%     Physical Exam Vitals and nursing note reviewed.  Constitutional:      General: He is not in acute distress.    Appearance: Normal appearance. He is normal weight. He is not ill-appearing, toxic-appearing or diaphoretic.  HENT:     Right Ear: Hearing, tympanic membrane, ear canal and external ear normal.     Left Ear: Hearing, tympanic membrane and external ear normal. Swelling and tenderness present.     Ears:     Comments: Swelling and tenderness in left ear canal Cardiovascular:     Rate and Rhythm: Normal rate and regular rhythm.     Pulses: Normal pulses.     Heart sounds: Normal heart sounds. No murmur heard.  No friction rub. No gallop.   Pulmonary:     Effort: Pulmonary effort is normal. No respiratory distress.     Breath sounds: Normal breath sounds. No stridor. No wheezing, rhonchi or rales.  Chest:     Chest wall: No tenderness.  Neurological:     Mental Status: He is alert and oriented to person, place, and time.     Imaging: No results found.   ASSESSMENT & PLAN:  1. Infective otitis externa of left ear     Meds ordered this encounter  Medications  . neomycin-polymyxin-hydrocortisone (CORTISPORIN) 3.5-10000-1 OTIC suspension    Sig: Place 4 drops into the left ear 3 (three) times daily.    Dispense:  10 mL    Refill:  0   Discharge instructions  Rest and drink plenty of fluids Prescribed Corticosporin Take medications as directed and to  completion Continue to use OTC ibuprofen and/ or tylenol as needed for pain control Follow up with PCP if symptoms persists Return here or go to the ER if you have any new or worsening symptoms   Reviewed expectations re: course of current medical issues. Questions answered. Outlined signs and symptoms indicating need for more acute intervention. Patient verbalized understanding. After Visit Summary given.  Note: This document was prepared using Dragon voice recognition software and may include unintentional dictation errors.    Emerson Monte, [place] 05/16/20 1444

## 2020-08-22 ENCOUNTER — Other Ambulatory Visit (INDEPENDENT_AMBULATORY_CARE_PROVIDER_SITE_OTHER): Payer: Self-pay | Admitting: Internal Medicine

## 2020-08-29 ENCOUNTER — Other Ambulatory Visit: Payer: Self-pay

## 2020-08-29 ENCOUNTER — Ambulatory Visit (INDEPENDENT_AMBULATORY_CARE_PROVIDER_SITE_OTHER): Payer: BC Managed Care – PPO | Admitting: Nurse Practitioner

## 2020-08-29 ENCOUNTER — Encounter (INDEPENDENT_AMBULATORY_CARE_PROVIDER_SITE_OTHER): Payer: Self-pay | Admitting: Nurse Practitioner

## 2020-08-29 VITALS — BP 124/80 | HR 93 | Temp 97.8°F | Ht 71.75 in | Wt 249.8 lb

## 2020-08-29 DIAGNOSIS — Z13220 Encounter for screening for lipoid disorders: Secondary | ICD-10-CM

## 2020-08-29 DIAGNOSIS — E669 Obesity, unspecified: Secondary | ICD-10-CM | POA: Diagnosis not present

## 2020-08-29 DIAGNOSIS — Z6834 Body mass index (BMI) 34.0-34.9, adult: Secondary | ICD-10-CM

## 2020-08-29 DIAGNOSIS — F32A Depression, unspecified: Secondary | ICD-10-CM

## 2020-08-29 DIAGNOSIS — Z131 Encounter for screening for diabetes mellitus: Secondary | ICD-10-CM | POA: Diagnosis not present

## 2020-08-29 DIAGNOSIS — Z139 Encounter for screening, unspecified: Secondary | ICD-10-CM

## 2020-08-29 DIAGNOSIS — I10 Essential (primary) hypertension: Secondary | ICD-10-CM

## 2020-08-29 DIAGNOSIS — Z1329 Encounter for screening for other suspected endocrine disorder: Secondary | ICD-10-CM

## 2020-08-29 MED ORDER — AMLODIPINE BESYLATE 10 MG PO TABS: 10.0000 mg | ORAL_TABLET | Freq: Every day | ORAL | 1 refills | Status: DC

## 2020-08-29 MED ORDER — MIRTAZAPINE 30 MG PO TABS: 30.0000 mg | ORAL_TABLET | Freq: Every day | ORAL | 1 refills | Status: DC

## 2020-08-29 NOTE — Progress Notes (Signed)
   Subjective:  Patient ID: Austin Calderon, male    DOB: 05/05/1956  Age: 64 y.o. MRN: [id number]  CC:  Chief Complaint  Patient presents with  . Establish Care    Needs medications refills      HPI  This patient arrives today for the above.  He does not have any acute complaints today, and tells me that he is looking to find a new doctor because he did not feel like his previous office was responsive enough to attempts at communication.  He tells me he generally feels well and he is healthy.  We did discuss health maintenance and preventative health care today.  He has not had a colonoscopy or any other colon cancer screening completed in the past.  He denies any knowledge of family history of colon cancer or personal history of inflammatory bowel disease.  He is interested in considering colonoscopy versus Cologuard for colon cancer screening, however does not want to have this completed at this time.  He has not had the flu shot this year and is not open to having it administered as of right now.  He tells me he had COVID-19 earlier this year and has not had the vaccines administered and does not want them done at this time.  He has initiated the shingles vaccine series but has not completed it, and is not interested in completing it at this time.  He believes he has been screened for hepatitis C in the past and was told that it was negative.  He does request refill of his amlodipine for treatment of his hypertension as well as mirtazapine which she takes for depression and insomnia.  He tells me that his mother passed away approximately 4 months ago and he is still grieving.  Past Medical History:  Diagnosis Date  . Chronic back pain   . Family history of adverse reaction to anesthesia    sister gets sick with anesthesia  . GERD (gastroesophageal reflux disease)    hx of-was on meds yrs ago  . History of gastric ulcer   . HTN (hypertension)    was on Norvasc but has been off for  several months  . Nocturia   . RBBB (right bundle branch block)   . Weakness    both legs occasionally      Family History  Problem Relation Age of Onset  . Breast cancer Mother     Social History   Social History Narrative  . Not on file   Social History   Tobacco Use  . Smoking status: Former Smoker  . Smokeless tobacco: Never Used  . Tobacco comment: quit smoking in Mar 2015  Substance Use Topics  . Alcohol use: Yes    Comment: 4-5 beers daily     Current Meds  Medication Sig  . [DISCONTINUED] amLODipine (NORVASC) 10 MG tablet Take 10 mg by mouth daily.  . [DISCONTINUED] mirtazapine (REMERON) 30 MG tablet Take 30 mg by mouth at bedtime.    ROS:  Review of Systems  Constitutional: Negative for fever and weight loss.  Eyes: Negative for blurred vision.  Respiratory: Negative for shortness of breath.   Cardiovascular: Negative for chest pain.  Gastrointestinal: Negative for abdominal pain and blood in stool.  Neurological: Negative for dizziness and headaches.     Objective:   Today's Vitals: BP 124/80   Pulse 93   Temp 97.8 F (36.6 C) (Temporal)   Ht 5' 11.75" (1.822 m)     Wt 249 lb 12.8 oz (113.3 kg)   SpO2 95%   BMI 34.12 kg/m  Vitals with BMI 08/29/2020 05/16/2020 01/07/2015  Height 5' 11.75" - -  Weight 249 lbs 13 oz - -  BMI 34.13 - -  Systolic 124 186 169  Diastolic 80 99 86  Pulse 93 86 63     Physical Exam Vitals reviewed.  Constitutional:      Appearance: Normal appearance.  HENT:     Head: Normocephalic and atraumatic.  Cardiovascular:     Rate and Rhythm: Normal rate and regular rhythm.  Pulmonary:     Effort: Pulmonary effort is normal.     Breath sounds: Normal breath sounds.  Musculoskeletal:     Cervical back: Neck supple.  Skin:    General: Skin is warm and dry.  Neurological:     Mental Status: He is alert and oriented to person, place, and time.  Psychiatric:        Mood and Affect: Mood normal.        Behavior:  Behavior normal.        Thought Content: Thought content normal.        Judgment: Judgment normal.          Assessment and Plan   1. Primary hypertension   2. Depression, unspecified depression type   3. Screening for diabetes mellitus   4. Class 1 obesity with serious comorbidity and body mass index (BMI) of 34.0 to 34.9 in adult, unspecified obesity type   5. Screening for thyroid disorder   6. Screening for condition   7. Screening, lipid      Plan: 1.  Blood pressure well controlled on amlodipine will order refill today. 2.  We will order refill of mirtazapine today. 3.-7.  We will check blood work today for further evaluation.  Of note, colon cancer screening was discussed and we may need to revisit this in the near future to determine whether he would like to try colonoscopy versus Cologuard to complete screening.  He was also encouraged to let us know if he would like flu shot administered.  He did mention he has not had a tetanus shot within the last 10 years and I encouraged him to notify us if he experiences any penetrating wounds or burns at which point we should administer the tetanus shot.  He tells me he understands.  I did also discuss that there have been some reoccurrence of COVID-19 infections and that recommendations currently are that individuals with a history of COVID-19 infection still get immunized.  He will consider this.   Tests ordered Orders Placed This Encounter  Procedures  . CBC with Differential/Platelets  . CMP with eGFR([facility])  . Hemoglobin A1c  . TSH  . Lipid Panel      Meds ordered this encounter  Medications  . amLODipine (NORVASC) 10 MG tablet    Sig: Take 1 tablet (10 mg total) by mouth daily.    Dispense:  90 tablet    Refill:  1    Order Specific Question:   Supervising Provider    Answer:   GOSRANI, NIMISH C [[id number]]  . mirtazapine (REMERON) 30 MG tablet    Sig: Take 1 tablet (30 mg total) by mouth at bedtime.    Dispense:   90 tablet    Refill:  1    Order Specific Question:   Supervising Provider    Answer:   GOSRANI, NIMISH C [[id number]]    Patient to   follow-up in 6 weeks for his annual physical exam, or sooner as needed.  Ailene Ards, NP

## 2020-08-30 LAB — COMPLETE METABOLIC PANEL WITH GFR
AG Ratio: 1.5 (calc) (ref 1.0–2.5)
ALT: 28 U/L (ref 9–46)
AST: 18 U/L (ref 10–35)
Albumin: 4.5 g/dL (ref 3.6–5.1)
Alkaline phosphatase (APISO): 116 U/L (ref 35–144)
BUN: 12 mg/dL (ref 7–25)
CO2: 26 mmol/L (ref 20–32)
Calcium: 9.8 mg/dL (ref 8.6–10.3)
Chloride: 102 mmol/L (ref 98–110)
Creat: 1.11 mg/dL (ref 0.70–1.25)
GFR, Est African American: 81 mL/min/{1.73_m2} (ref >=60)
GFR, Est Non African American: 70 mL/min/{1.73_m2} (ref >=60)
Globulin: 3 g/dL (calc) (ref 1.9–3.7)
Glucose, Bld: 107 mg/dL — ABNORMAL HIGH (ref 65–99)
Potassium: 4.4 mmol/L (ref 3.5–5.3)
Sodium: 137 mmol/L (ref 135–146)
Total Bilirubin: 0.7 mg/dL (ref 0.2–1.2)
Total Protein: 7.5 g/dL (ref 6.1–8.1)

## 2020-08-30 LAB — CBC WITH DIFFERENTIAL/PLATELET
Absolute Monocytes: 731 cells/uL (ref 200–950)
Basophils Absolute: 143 cells/uL (ref 0–200)
Basophils Relative: 1.7 %
Eosinophils Absolute: 311 cells/uL (ref 15–500)
Eosinophils Relative: 3.7 %
HCT: 48.9 % (ref 38.5–50.0)
Hemoglobin: 17 g/dL (ref 13.2–17.1)
Lymphs Abs: 1705 cells/uL (ref 850–3900)
MCH: 33.9 pg — ABNORMAL HIGH (ref 27.0–33.0)
MCHC: 34.8 g/dL (ref 32.0–36.0)
MCV: 97.6 fL (ref 80.0–100.0)
MPV: 10.4 fL (ref 7.5–12.5)
Monocytes Relative: 8.7 %
Neutro Abs: 5510 cells/uL (ref 1500–7800)
Neutrophils Relative %: 65.6 %
Platelets: 275 10*3/uL (ref 140–400)
RBC: 5.01 10*6/uL (ref 4.20–5.80)
RDW: 11.8 % (ref 11.0–15.0)
Total Lymphocyte: 20.3 %
WBC: 8.4 10*3/uL (ref 3.8–10.8)

## 2020-08-30 LAB — LIPID PANEL
Cholesterol: 290 mg/dL — ABNORMAL HIGH (ref <200)
HDL: 71 mg/dL (ref >=40)
LDL Cholesterol (Calc): 197 mg/dL (calc) — ABNORMAL HIGH
Non-HDL Cholesterol (Calc): 219 mg/dL (calc) — ABNORMAL HIGH (ref <130)
Total CHOL/HDL Ratio: 4.1 (calc) (ref <5.0)
Triglycerides: 97 mg/dL (ref <150)

## 2020-08-30 LAB — HEMOGLOBIN A1C
Hgb A1c MFr Bld: 5.6 % of total Hgb (ref <5.7)
Mean Plasma Glucose: 114 mg/dL
eAG (mmol/L): 6.3 mmol/L

## 2020-08-30 LAB — TSH: TSH: 1.43 mIU/L (ref 0.40–4.50)

## 2020-10-19 ENCOUNTER — Other Ambulatory Visit: Payer: Self-pay

## 2020-10-19 ENCOUNTER — Encounter (INDEPENDENT_AMBULATORY_CARE_PROVIDER_SITE_OTHER): Payer: Self-pay | Admitting: Nurse Practitioner

## 2020-10-19 ENCOUNTER — Ambulatory Visit (INDEPENDENT_AMBULATORY_CARE_PROVIDER_SITE_OTHER): Payer: 59 | Admitting: Nurse Practitioner

## 2020-10-19 VITALS — BP 140/89 | HR 75 | Ht 72.0 in | Wt 259.0 lb

## 2020-10-19 DIAGNOSIS — Z0001 Encounter for general adult medical examination with abnormal findings: Secondary | ICD-10-CM

## 2020-10-19 DIAGNOSIS — E785 Hyperlipidemia, unspecified: Secondary | ICD-10-CM

## 2020-10-19 MED ORDER — ATORVASTATIN CALCIUM 10 MG PO TABS: 10.0000 mg | ORAL_TABLET | Freq: Every day | ORAL | 0 refills | Status: DC

## 2020-10-19 NOTE — Patient Instructions (Signed)
Atorvastatin Tablets What is this medicine? ATORVASTATIN (a TORE va sta tin) is a statin. It lowers bad cholesterol and triglyceride levels in the blood. It also increases good cholesterol levels. It is used with lifestyle changes, like diet and exercise. It may be used alone or with other drugs. This medicine may be used for other purposes; ask your health care provider or pharmacist if you have questions. COMMON BRAND NAME(S): Lipitor What should I tell my health care provider before I take this medicine? They need to know if you have any of these conditions:  diabetes (high blood sugar)  if you often drink alcohol  kidney disease  liver disease  muscle cramps, pain  stroke  thyroid disease  an unusual or allergic reaction to atorvastatin, other medicines, foods, dyes, or preservatives  pregnant or trying to get pregnant  breast-feeding How should I use this medicine? Take this medicine by mouth. Take it as directed on the prescription label at the same time every day. You can take it with or without food. If it upsets your stomach, take it with food. Keep taking it unless your health care provider tells you to stop. Do not take this medicine with grapefruit juice. Talk to your health care provider about the use of this medicine in children. While it may be prescribed for children as young as 10 for selected conditions, precautions do apply. Overdosage: If you think you have taken too much of this medicine contact a poison control center or emergency room at once. NOTE: This medicine is only for you. Do not share this medicine with others. What if I miss a dose? If you miss a dose, take it as soon as you can. If it is almost time for your next dose, take only that dose. Do not take double or extra doses. What may interact with this medicine? Do not take this medicine with any of the following medications:  dasabuvir; ombitasvir; paritaprevir; ritonavir  ombitasvir;  paritaprevir; ritonavir  posaconazole  red yeast rice This medicine may also interact with the following medications:  alcohol  birth control pills  certain antibiotics like erythromycin and clarithromycin  certain antivirals for HIV or hepatitis  certain medicines for cholesterol like fenofibrate, gemfibrozil, and niacin  certain medicines for fungal infections like ketoconazole and itraconazole  colchicine  cyclosporine  digoxin  grapefruit juice  rifampin This list may not describe all possible interactions. Give your health care provider a list of all the medicines, herbs, non-prescription drugs, or dietary supplements you use. Also tell them if you smoke, drink alcohol, or use illegal drugs. Some items may interact with your medicine. What should I watch for while using this medicine? Visit your health care provider for regular checks on your progress. Tell your health care provider if your symptoms do not start to get better or if they get worse. Your health care provider may tell you to stop taking this medicine if you develop muscle problems. If your muscle problems do not go away after stopping this medicine, contact your health care provider. Do not become pregnant while taking this medicine. Women should inform their health care provider if they wish to become pregnant or think they might be pregnant. There is potential for serious harm to an unborn child. Talk to your health care provider for more information. Do not breast-feed an infant while taking this medicine. Birth control may not work properly while you are taking this medicine. Talk to your health care provider about using  an extra method of birth control. This medicine may increase blood sugar. Ask your health care provider if changes in diet or medicines are needed if you have diabetes. If you are going to need surgery or other procedure, tell your health care provider that you are using this  medicine. Taking this medicine is only part of a total heart healthy program. Your health care provider may give you a special diet to follow. Avoid alcohol. Avoid smoking. Ask your health care provider how much you should exercise. What side effects may I notice from receiving this medicine? Side effects that you should report to your doctor or health care provider as soon as possible:  allergic reactions (skin rash, itching or hives; swelling of the face, lips, or tongue)  high blood sugar (increased hunger, thirst or urination; unusually weak or tired, blurry vision)  infection (fever, chills, cough, sore throat, pain or trouble passing urine)  joint pain  liver injury (dark yellow or brown urine; general ill feeling or flu-like symptoms; loss of appetite, right upper belly pain; unusually weak or tired, yellowing of the eyes or skin)  muscle injury (dark urine; trouble passing urine or change in the amount of urine; unusually weak or tired; muscle pain; back pain)  redness, blistering, peeling, or loosening of the skin, including inside the mouth Side effects that usually do not require medical attention (report to your doctor or health care provider if they continue or are bothersome):  diarrhea  nausea  upset stomach This list may not describe all possible side effects. Call your doctor for medical advice about side effects. You may report side effects to FDA at 1-800-FDA-1088. Where should I keep my medicine? Keep out of the reach of children and pets. Store at room temperature between 20 and 25 degrees C (68 and 77 degrees F). Get rid of any unused medicine after the expiration date. To get rid of medicines that are no longer needed or have expired:  Take the medicine to a medicine take-back program. Check with your pharmacy or law enforcement to find a location.  If you cannot return the medicine, check the label or package insert to see if the medicine should be thrown out  in the garbage or flushed down the toilet. If you are not sure, ask your health care provider. If it is safe to put it in the trash, take the medicine out of the container. Mix the medicine with cat litter, dirt, coffee grounds, or other unwanted substance. Seal the mixture in a bag or container. Put it in the trash. NOTE: This sheet is a summary. It may not cover all possible information. If you have questions about this medicine, talk to your doctor, pharmacist, or health care provider.  2021 [employer]vier/Gold Standard (2020-08-11 12:41:57)

## 2020-10-19 NOTE — Progress Notes (Signed)
Subjective:  Patient ID: Austin Calderon, male    DOB: 04-Sep-1956  Age: 65 y.o. MRN: [id number]  CC:  Chief Complaint  Patient presents with  . Annual Exam    I feel the best that I have felt in a long time.      HPI  This patient arrives today for the above.  At his last office visit he was establishing care here at that time blood work was collected.  Everything looked normal except his elevated cholesterol.  LDL greater than 190 and total cholesterol 290.  His HDL is at a great level at 71.  He tells me he is never been on medication to control his cholesterol but is open to taking medicine if needed.  He has no complaints today and tells me he is feeling very good.  Of note, he has not yet had his flu shot I did offer this again to him today but he prefers not to have it administered.  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  He still not agreeable to undergoing colon cancer screening.  Past Medical History:  Diagnosis Date  . Chronic back pain   . Family history of adverse reaction to anesthesia    sister gets sick with anesthesia  . GERD (gastroesophageal reflux disease)    hx of-was on meds yrs ago  . History of gastric ulcer   . HTN (hypertension)    was on Norvasc but has been off for several months  . Nocturia   . RBBB (right bundle branch block)   . Weakness    both legs occasionally      Family History  Problem Relation Age of Onset  . Breast cancer Mother     Social History   Social History Narrative  . Not on file   Social History   Tobacco Use  . Smoking status: Former Research scientist (life sciences)  . Smokeless tobacco: Never Used  . Tobacco comment: quit smoking in Mar 2015  Substance Use Topics  . Alcohol use: Yes    Comment: 4-5 beers daily     Current Meds  Medication Sig  . amLODipine (NORVASC) 10 MG tablet Take 1 tablet (10 mg total) by mouth daily.  Marland Kitchen atorvastatin (LIPITOR) 10 MG tablet Take 1 tablet (10 mg total) by mouth  daily.  . mirtazapine (REMERON) 30 MG tablet Take 1 tablet (30 mg total) by mouth at bedtime.    ROS:  Review of Systems  Constitutional: Negative for fever.  Eyes: Negative for blurred vision.  Respiratory: Negative for shortness of breath.   Cardiovascular: Negative for chest pain.  Gastrointestinal: Negative for abdominal pain.  Neurological: Negative for dizziness and headaches.     Objective:   Today's Vitals: BP 140/89   Pulse 75   Ht 6' (1.829 m)   Wt 259 lb (117.5 kg)   BMI 35.13 kg/m  Vitals with BMI 10/19/2020 08/29/2020 05/16/2020  Height 6\' 0"  5' 11.75" -  Weight 259 lbs 249 lbs 13 oz -  BMI 99.24 26.83 -  Systolic 419 622 297  Diastolic 89 80 99  Pulse 75 93 86     Physical Exam Vitals reviewed.  Constitutional:      General: He is not in acute distress.    Appearance: Normal appearance. He is not ill-appearing.  HENT:     Head: Normocephalic and atraumatic.     Right Ear: Ear canal and external ear normal. There is impacted  cerumen.     Left Ear: Ear canal and external ear normal. There is impacted cerumen.  Eyes:     General: No scleral icterus.    Extraocular Movements: Extraocular movements intact.     Conjunctiva/sclera: Conjunctivae normal.     Pupils: Pupils are equal, round, and reactive to light.  Neck:     Vascular: No carotid bruit.  Cardiovascular:     Rate and Rhythm: Normal rate and regular rhythm.     Pulses: Normal pulses.     Heart sounds: Normal heart sounds.  Pulmonary:     Effort: Pulmonary effort is normal.     Breath sounds: Normal breath sounds.  Abdominal:     General: Bowel sounds are normal. There is no distension.     Palpations: There is no mass.     Tenderness: There is no abdominal tenderness.     Hernia: No hernia is present.  Musculoskeletal:        General: No swelling or tenderness.     Cervical back: Normal range of motion and neck supple. No rigidity.  Lymphadenopathy:     Cervical: No cervical adenopathy.   Skin:    General: Skin is warm and dry.  Neurological:     General: No focal deficit present.     Mental Status: He is alert and oriented to person, place, and time.     Cranial Nerves: No cranial nerve deficit.     Sensory: No sensory deficit.     Motor: No weakness.     Gait: Gait normal.  Psychiatric:        Mood and Affect: Mood normal.        Behavior: Behavior normal.        Judgment: Judgment normal.          Assessment and Plan   1. Encounter for general adult medical examination with abnormal findings   2. Hyperlipidemia, unspecified hyperlipidemia type      Plan: 1.  We will hold off on administering flu vaccine, patient is still hesitant to get the COVID-19 vaccine.  He does not want to be referred for colon cancer screening at this time.  Will consider screening for HIV next time blood is drawn.  Will consider offering tetanus shot at next office visit.  We did discuss his blood work today in detail.   2.  We did discuss that because his LDL is greater than 190 recommendation is that patient be started on statin therapy.  He is open to trying this medication we did discuss common potential side effects as well as serious side effects, and he would like to try the medicine.  We will start him on atorvastatin 10 mg by mouth every evening and he will return to office in approximately 1 month to see how he is tolerating medication as well as to recheck lipid panel and CMP.  We also had discussion regarding diet and that I encouraged him to focus on eating as many leafy greens and vegetables as he can, to avoid red meat and processed meat, and to avoid processed foods.  He tells me that he is unwilling to stop eating his steak however he only has steak a couple of times a month.  He was encouraged to continue focusing on eating salads which he reports eating on a almost daily basis.   Tests ordered No orders of the defined types were placed in this encounter.     Meds  ordered this encounter  Medications  . atorvastatin (LIPITOR) 10 MG tablet    Sig: Take 1 tablet (10 mg total) by mouth daily.    Dispense:  90 tablet    Refill:  0    Order Specific Question:   Supervising Provider    Answer:   Doree Albee [[id number]]    Patient to follow-up in 1 month or sooner as needed.  Today in addition to performing his annual physical exam I also performed an office visit to address his hyperlipidemia.  Ailene Ards, NP

## 2020-11-16 ENCOUNTER — Ambulatory Visit (INDEPENDENT_AMBULATORY_CARE_PROVIDER_SITE_OTHER): Payer: 59 | Admitting: Nurse Practitioner

## 2021-02-08 ENCOUNTER — Other Ambulatory Visit: Payer: Self-pay

## 2021-02-08 ENCOUNTER — Encounter (INDEPENDENT_AMBULATORY_CARE_PROVIDER_SITE_OTHER): Payer: Self-pay | Admitting: Nurse Practitioner

## 2021-02-08 ENCOUNTER — Ambulatory Visit (INDEPENDENT_AMBULATORY_CARE_PROVIDER_SITE_OTHER): Payer: 59 | Admitting: Nurse Practitioner

## 2021-02-08 VITALS — BP 148/84 | HR 101 | Temp 97.2°F | Ht 71.75 in | Wt 253.6 lb

## 2021-02-08 DIAGNOSIS — Z125 Encounter for screening for malignant neoplasm of prostate: Secondary | ICD-10-CM

## 2021-02-08 DIAGNOSIS — L989 Disorder of the skin and subcutaneous tissue, unspecified: Secondary | ICD-10-CM | POA: Diagnosis not present

## 2021-02-08 DIAGNOSIS — E785 Hyperlipidemia, unspecified: Secondary | ICD-10-CM

## 2021-02-08 DIAGNOSIS — R197 Diarrhea, unspecified: Secondary | ICD-10-CM

## 2021-02-08 NOTE — Progress Notes (Signed)
Subjective:  Patient ID: Austin Calderon, male    DOB: [birth year]  Age: 65 y.o. MRN: [id number]  CC:  Chief Complaint  Patient presents with  . Follow-up    Needs refills, has dry skin for about 2 months and may need dermatologist, anything he eats goes right through him and has been going on for a while  . Hyperlipidemia  . Other    Diarrhea, Prostate cancer screening, dry skin      HPI  This patient arrives today for the above.  Hyperlipidemia/diarrhea: Has a history of hyperlipidemia with last LDL being 197.  We started him on atorvastatin last office visit.  Tells me since then he has been experiencing daily diarrhea.  He tells me anytime he eats any food it seems to "go right through him".  He tells me he has significant fecal urgency at times but denies any abdominal pain, nausea, vomiting, blood in his stool, etc.  He will take over-the-counter antidiarrheal pills which will result in improvement in his loose stools.  He has not made any significant changes to his diet or changes to the type of water he drinks (he drinks bottled water), and has not had any significant recent travel history.  Prostate cancer screening: He has not been screened for prostate cancer via PSA level, but would be agreeable to having this done today.  Dry skin: He tells me his very dry and flaky, he is wondering what he can take to treat this. He has been using OTC suave lotion without improvement.   Past Medical History:  Diagnosis Date  . Chronic back pain   . Family history of adverse reaction to anesthesia    sister gets sick with anesthesia  . GERD (gastroesophageal reflux disease)    hx of-was on meds yrs ago  . History of gastric ulcer   . HTN (hypertension)    was on Norvasc but has been off for several months  . Nocturia   . RBBB (right bundle branch block)   . Weakness    both legs occasionally      Family History  Problem Relation Age of Onset  . Breast cancer Mother      Social History   Social History Narrative  . Not on file   Social History   Tobacco Use  . Smoking status: Former Research scientist (life sciences)  . Smokeless tobacco: Never Used  . Tobacco comment: quit smoking in Mar 2015  Substance Use Topics  . Alcohol use: Yes    Comment: 4-5 beers daily     Current Meds  Medication Sig  . amLODipine (NORVASC) 10 MG tablet Take 1 tablet (10 mg total) by mouth daily.  . diphenhydramine-acetaminophen (TYLENOL PM) 25-500 MG TABS tablet Take 2 tablets by mouth at bedtime as needed.  . mirtazapine (REMERON) 30 MG tablet Take 1 tablet (30 mg total) by mouth at bedtime.  . MULTIPLE VITAMIN PO Take 1 capsule by mouth daily.  . [DISCONTINUED] atorvastatin (LIPITOR) 10 MG tablet Take 1 tablet (10 mg total) by mouth daily.    ROS:  Review of Systems  Constitutional: Negative for diaphoresis, fever, malaise/fatigue and weight loss.  Respiratory: Negative for shortness of breath.   Cardiovascular: Negative for chest pain.  Gastrointestinal: Positive for diarrhea. Negative for abdominal pain, blood in stool, melena, nausea and vomiting.       (+) fecal urgency  Neurological: Negative for dizziness and headaches.     Objective:   Today's Vitals:  BP (!) 148/84   Pulse (!) 101   Temp (!) 97.2 F (36.2 C) (Temporal)   Ht 5' 11.75" (1.822 m)   Wt 253 lb 9.6 oz (115 kg)   SpO2 97%   BMI 34.63 kg/m  Vitals with BMI 02/08/2021 10/19/2020 08/29/2020  Height 5' 11.75" 6' 0"  5' 11.75"  Weight 253 lbs 10 oz 259 lbs 249 lbs 13 oz  BMI 34.65 81.15 72.62  Systolic 035 597 416  Diastolic 84 89 80  Pulse 384 75 93     Physical Exam Vitals reviewed.  Constitutional:      Appearance: Normal appearance.  HENT:     Head: Normocephalic and atraumatic.  Cardiovascular:     Rate and Rhythm: Normal rate and regular rhythm.  Pulmonary:     Effort: Pulmonary effort is normal.     Breath sounds: Normal breath sounds.  Musculoskeletal:     Cervical back: Neck supple.   Skin:    General: Skin is warm and dry.       Neurological:     Mental Status: He is alert and oriented to person, place, and time.  Psychiatric:        Mood and Affect: Mood normal.        Behavior: Behavior normal.        Thought Content: Thought content normal.        Judgment: Judgment normal.          Assessment and Plan   1. Skin lesion   2. Hyperlipidemia, unspecified hyperlipidemia type   3. Diarrhea, unspecified type   4. Prostate cancer screening      Plan: 1.  I am not sure of the etiology of his dry skin but I recommended he first try CeraVe lotion and apply this twice a day.  We will also refer him to dermatology however if his skin improves with use of CeraVe he can cancel this referral. 2.,  3.  I think most likely his diarrhea is a negative side effect from atorvastatin.  I am going to order testing of the stool sample but have recommended we stop the atorvastatin for a few weeks to see if the diarrhea resolves.  If diarrhea does resolve with stopping atorvastatin may consider trialing Zetia. 4.  We will order PSA to screen for prostate cancer.  Tests ordered Orders Placed This Encounter  Procedures  . Salmonella/Shigella Cult, Campy EIA and Shiga Toxin reflex  . CMP with eGFR([facility])  . CBC with Differential/Platelets  . PSA  . Ambulatory referral to Dermatology      No orders of the defined types were placed in this encounter.   Patient to follow-up in 6 weeks or sooner as needed.  Ailene Ards, NP

## 2021-02-08 NOTE — Patient Instructions (Signed)
CERAVE - lotion to use on face

## 2021-02-09 LAB — CBC WITH DIFFERENTIAL/PLATELET
Absolute Monocytes: 801 cells/uL (ref 200–950)
Basophils Absolute: 91 cells/uL (ref 0–200)
Basophils Relative: 1 %
Eosinophils Absolute: 282 cells/uL (ref 15–500)
Eosinophils Relative: 3.1 %
HCT: 51.2 % — ABNORMAL HIGH (ref 38.5–50.0)
Hemoglobin: 16.6 g/dL (ref 13.2–17.1)
Lymphs Abs: 1856 cells/uL (ref 850–3900)
MCH: 32.2 pg (ref 27.0–33.0)
MCHC: 32.4 g/dL (ref 32.0–36.0)
MCV: 99.2 fL (ref 80.0–100.0)
MPV: 10.3 fL (ref 7.5–12.5)
Monocytes Relative: 8.8 %
Neutro Abs: 6070 cells/uL (ref 1500–7800)
Neutrophils Relative %: 66.7 %
Platelets: 238 10*3/uL (ref 140–400)
RBC: 5.16 10*6/uL (ref 4.20–5.80)
RDW: 12.5 % (ref 11.0–15.0)
Total Lymphocyte: 20.4 %
WBC: 9.1 10*3/uL (ref 3.8–10.8)

## 2021-02-09 LAB — COMPLETE METABOLIC PANEL WITH GFR
AG Ratio: 1.5 (calc) (ref 1.0–2.5)
ALT: 40 U/L (ref 9–46)
AST: 31 U/L (ref 10–35)
Albumin: 4.4 g/dL (ref 3.6–5.1)
Alkaline phosphatase (APISO): 140 U/L (ref 35–144)
BUN: 12 mg/dL (ref 7–25)
CO2: 25 mmol/L (ref 20–32)
Calcium: 9.7 mg/dL (ref 8.6–10.3)
Chloride: 104 mmol/L (ref 98–110)
Creat: 1.19 mg/dL (ref 0.70–1.25)
GFR, Est African American: 74 mL/min/{1.73_m2} (ref >=60)
GFR, Est Non African American: 64 mL/min/{1.73_m2} (ref >=60)
Globulin: 2.9 g/dL (calc) (ref 1.9–3.7)
Glucose, Bld: 128 mg/dL (ref 65–139)
Potassium: 4.3 mmol/L (ref 3.5–5.3)
Sodium: 138 mmol/L (ref 135–146)
Total Bilirubin: 0.5 mg/dL (ref 0.2–1.2)
Total Protein: 7.3 g/dL (ref 6.1–8.1)

## 2021-02-09 LAB — PSA: PSA: 0.25 ng/mL (ref <=4.00)

## 2021-02-15 LAB — SALMONELLA/SHIGELLA CULT, CAMPY EIA AND SHIGA TOXIN RFL ECOLI
MICRO NUMBER: 11974242
Result:: NOT DETECTED
SHIGA RESULT:: NOT DETECTED
SPECIMEN QUALITY: ADEQUATE

## 2021-02-23 ENCOUNTER — Other Ambulatory Visit (INDEPENDENT_AMBULATORY_CARE_PROVIDER_SITE_OTHER): Payer: Self-pay | Admitting: Nurse Practitioner

## 2021-02-23 DIAGNOSIS — I10 Essential (primary) hypertension: Secondary | ICD-10-CM

## 2021-02-23 DIAGNOSIS — F32A Depression, unspecified: Secondary | ICD-10-CM

## 2021-03-02 ENCOUNTER — Other Ambulatory Visit (INDEPENDENT_AMBULATORY_CARE_PROVIDER_SITE_OTHER): Payer: Self-pay | Admitting: Nurse Practitioner

## 2021-03-02 DIAGNOSIS — F32A Depression, unspecified: Secondary | ICD-10-CM

## 2021-03-02 DIAGNOSIS — I10 Essential (primary) hypertension: Secondary | ICD-10-CM

## 2021-03-22 ENCOUNTER — Ambulatory Visit (INDEPENDENT_AMBULATORY_CARE_PROVIDER_SITE_OTHER): Payer: 59 | Admitting: Nurse Practitioner

## 2021-04-06 ENCOUNTER — Ambulatory Visit (INDEPENDENT_AMBULATORY_CARE_PROVIDER_SITE_OTHER): Payer: 59 | Admitting: Nurse Practitioner

## 2021-04-06 ENCOUNTER — Encounter (INDEPENDENT_AMBULATORY_CARE_PROVIDER_SITE_OTHER): Payer: Self-pay | Admitting: Nurse Practitioner

## 2021-04-06 ENCOUNTER — Other Ambulatory Visit: Payer: Self-pay

## 2021-04-06 VITALS — BP 144/80 | HR 66 | Temp 97.8°F | Resp 19 | Ht 71.0 in | Wt 252.0 lb

## 2021-04-06 DIAGNOSIS — R197 Diarrhea, unspecified: Secondary | ICD-10-CM | POA: Diagnosis not present

## 2021-04-06 DIAGNOSIS — I10 Essential (primary) hypertension: Secondary | ICD-10-CM | POA: Diagnosis not present

## 2021-04-06 DIAGNOSIS — E785 Hyperlipidemia, unspecified: Secondary | ICD-10-CM | POA: Insufficient documentation

## 2021-04-06 NOTE — Progress Notes (Signed)
Subjective:  Patient ID: Austin Calderon, male    DOB: 01-30-1956  Age: 65 y.o. MRN: [id number]  CC:  Chief Complaint  Patient presents with   Follow-up   Hypertension   Hyperlipidemia   Other    Diarrhea      HPI  This patient arrives today for the above.  Hypertension: He continues on amlodipine and is tolerating this well.  Hyperlipidemia: He was on atorvastatin, but we stopped this due to him experiencing diarrhea once he started the medicine.  Today is getting consider discussing alternatives such as Zetia however he reports he still having some diarrhea please see below for further information.  Last LDL was 197.  Diarrhea: He has been having diarrhea for a couple of months now.  He tells me the frequency is getting less he still has diarrhea every morning after breakfast.  He takes an over-the-counter antidiarrhea pill every evening.  He has fecal urgency but no incontinence.  He does not drink a lot of coffee but does drink 1 [employer] a day as well as 2 to 3 glasses of tea usually during the day.  He denies any abdominal pain, mucus in his stool, or blood in his stool.  We have checked stool for Salmonella and Shigella and it was negative for both.  Past Medical History:  Diagnosis Date   Chronic back pain    Family history of adverse reaction to anesthesia    sister gets sick with anesthesia   GERD (gastroesophageal reflux disease)    hx of-was on meds yrs ago   History of gastric ulcer    HTN (hypertension)    was on Norvasc but has been off for several months   Nocturia    RBBB (right bundle branch block)    Weakness    both legs occasionally      Family History  Problem Relation Age of Onset   Breast cancer Mother     Social History   Social History Narrative   Not on file   Social History   Tobacco Use   Smoking status: Former   Smokeless tobacco: Never   Tobacco comments:    quit smoking in Mar 2015  Substance Use Topics   Alcohol  use: Yes    Comment: 4-5 beers daily     Current Meds  Medication Sig   amLODipine (NORVASC) 10 MG tablet TAKE 1 TABLET(10 MG) BY MOUTH DAILY   diphenhydramine-acetaminophen (TYLENOL PM) 25-500 MG TABS tablet Take 2 tablets by mouth at bedtime as needed.   mirtazapine (REMERON) 30 MG tablet TAKE 1 TABLET(30 MG) BY MOUTH AT BEDTIME   MULTIPLE VITAMIN PO Take 1 capsule by mouth daily.    ROS:  See HPI   Objective:   Today's Vitals: BP (!) 144/80 (BP Location: Right Arm, Patient Position: Sitting, Cuff Size: Large)   Pulse 66   Temp 97.8 F (36.6 C) (Temporal)   Resp 19   Ht '5\' 11"'$  (1.803 m)   Wt 252 lb (114.3 kg)   SpO2 99%   BMI 35.15 kg/m  Vitals with BMI 04/06/2021 02/08/2021 10/19/2020  Height '5\' 11"'$  5' 11.75" '6\' 0"'$   Weight 252 lbs 253 lbs 10 oz 259 lbs  BMI 35.16 [id number]  Systolic [id number]  Diastolic 80 84 89  Pulse 66 101 75     Physical Exam Vitals reviewed.  Constitutional:      Appearance: Normal appearance.  HENT:  Head: Normocephalic and atraumatic.  Cardiovascular:     Rate and Rhythm: Normal rate and regular rhythm.  Pulmonary:     Effort: Pulmonary effort is normal.     Breath sounds: Normal breath sounds.  Abdominal:     General: Bowel sounds are increased. There is distension.     Palpations: Abdomen is soft. There is no mass.     Tenderness: There is no abdominal tenderness.  Musculoskeletal:     Cervical back: Neck supple.  Skin:    General: Skin is warm and dry.  Neurological:     Mental Status: He is alert and oriented to person, place, and time.  Psychiatric:        Mood and Affect: Mood normal.        Behavior: Behavior normal.        Thought Content: Thought content normal.        Judgment: Judgment normal.         Assessment and Plan   1. Diarrhea, unspecified type   2. Primary hypertension   3. Hyperlipidemia, unspecified hyperlipidemia type      Plan: 1.  Initially thought the diarrhea was side effect of  atorvastatin.  However now unsure of current etiology.  We will refer to gastroenterology for further evaluation and assistance with managing his diarrhea.  In the meantime he will continue to use his as needed antidiarrheal. 2.  He will continue on amlodipine as prescribed. 3.  We will consider adding a different agent such as Zetia but will wait until work-up for diarrhea is completed.   Tests ordered Orders Placed This Encounter  Procedures   Ambulatory referral to Gastroenterology       No orders of the defined types were placed in this encounter.   Patient to follow-up in 3 months or sooner as needed.  Ailene Ards, NP

## 2021-04-10 ENCOUNTER — Encounter: Payer: Self-pay | Admitting: Internal Medicine

## 2021-05-31 ENCOUNTER — Other Ambulatory Visit: Payer: Self-pay

## 2021-05-31 ENCOUNTER — Encounter: Payer: Self-pay | Admitting: Internal Medicine

## 2021-05-31 ENCOUNTER — Ambulatory Visit: Payer: 59 | Admitting: Internal Medicine

## 2021-05-31 VITALS — BP 159/87 | HR 87 | Temp 98.0°F | Ht 71.0 in | Wt 254.0 lb

## 2021-05-31 DIAGNOSIS — Z1211 Encounter for screening for malignant neoplasm of colon: Secondary | ICD-10-CM

## 2021-05-31 DIAGNOSIS — R197 Diarrhea, unspecified: Secondary | ICD-10-CM

## 2021-05-31 NOTE — Patient Instructions (Signed)
I am happy to hear that your diarrhea has resolved.  Continue on your current diet.  I would recommend colon cancer screening of some sort.  If you are not interested in undergoing screening colonoscopy, then we can perform a stool test called Cologuard.  If you like to have this done, then please let us know.  If your diarrhea returns or you have new onset other gastrointestinal symptoms then please give [place] a call.  Otherwise follow-up as needed.  It was very nice meeting you today.  Dr. Abbey Chatters  At [facility] we value your feedback. You may receive a survey about your visit today. Please share your experience as we strive to create trusting relationships with our patients to provide genuine, compassionate, quality care.  We appreciate your understanding and patience as we review any laboratory studies, imaging, and other diagnostic tests that are ordered as we care for you. Our office policy is 5 business days for review of these results, and any emergent or urgent results are addressed in a timely manner for your best interest. If you do not hear from our office in 1 week, please contact us.   We also encourage the use of MyChart, which contains your medical information for your review as well. If you are not enrolled in this feature, an access code is on this after visit summary for your convenience. Thank you for allowing [place] to be involved in your care.  It was great to see you today!  I hope you have a great rest of your summer!!    Austin Calderon. Abbey Chatters, D.O. Gastroenterology and Hepatology [facility]

## 2021-06-01 NOTE — Progress Notes (Signed)
Primary Care Physician:  Pcp, No Primary Gastroenterologist:  Dr. Abbey Chatters  Chief Complaint  Patient presents with   Diarrhea    Better. Changed diet, eating out less, drinking less etoh, eating more fruits. Never had tcs, doesn't want one.    HPI:   Austin Calderon is a 65 y.o. male who presents to the clinic today by referral from his PCP Austin Calderon for evaluation.  Patient states approximately 3 months ago he had sudden onset of diarrhea.  This lasted for months.  Underwent stool testing for Shigella and Salmonella which were negative.  Denies any associated abdominal pain, melena, hematochezia, or mucus in his stool associated with this.  States he was under stress at the time due to personal relationship.  He has since moved on from that relationship and change his diet and his symptoms have resolved.  Notes regular bowel movements now.  Denies any upper GI symptoms including GERD, dysphagia, odynophagia, epigastric/chest pain.  No previous colon cancer screening performed.  No family history of colorectal malignancy.  Patient previously was an avid golfer, 2+ handicap.  Can no longer play due to multiple joints issues.  Past Medical History:  Diagnosis Date   Chronic back pain    Family history of adverse reaction to anesthesia    sister gets sick with anesthesia   GERD (gastroesophageal reflux disease)    hx of-was on meds yrs ago   History of gastric ulcer    HTN (hypertension)    was on Norvasc but has been off for several months   Nocturia    RBBB (right bundle branch block)    Weakness    both legs occasionally    Past Surgical History:  Procedure Laterality Date   BACK SURGERY     ESOPHAGOGASTRODUODENOSCOPY     HARDWARE REMOVAL Left 01/07/2015   Procedure: HARDWARE REMOVAL LEFT L4-5 PEDICLE SCREWS;  Surgeon: Earnie Larsson, MD;  Location: [facility];  Service: Neurosurgery;  Laterality: Left;  HARDWARE REMOVAL LEFT L4-5 PEDICLE SCREWS   KNEE ARTHROSCOPY Right     LUMBAR FUSION     SPINAL FUSION      Current Outpatient Medications  Medication Sig Dispense Refill   amLODipine (NORVASC) 10 MG tablet TAKE 1 TABLET(10 MG) BY MOUTH DAILY 90 tablet 1   diphenhydramine-acetaminophen (TYLENOL PM) 25-500 MG TABS tablet Take 2 tablets by mouth at bedtime as needed.     mirtazapine (REMERON) 30 MG tablet TAKE 1 TABLET(30 MG) BY MOUTH AT BEDTIME 90 tablet 1   MULTIPLE VITAMIN PO Take 1 capsule by mouth daily.     No current facility-administered medications for this visit.    Allergies as of 05/31/2021 - Review Complete 05/31/2021  Allergen Reaction Noted   Valium [diazepam] Other (See Comments) 01/05/2015    Family History  Problem Relation Age of Onset   Breast cancer Mother     Social History   Socioeconomic History   Marital status: Married    Spouse name: Not on file   Number of children: Not on file   Years of education: Not on file   Highest education level: Not on file  Occupational History   Not on file  Tobacco Use   Smoking status: Former   Smokeless tobacco: Never   Tobacco comments:    quit smoking in Mar 2015  Vaping Use   Vaping Use: Never used  Substance and Sexual Activity   Alcohol use: Yes    Comment: 4-5 beers  daily   Drug use: No   Sexual activity: Yes  Other Topics Concern   Not on file  Social History Narrative   Not on file   Social Determinants of Health   Financial Resource Strain: Not on file  Food Insecurity: Not on file  Transportation Needs: Not on file  Physical Activity: Not on file  Stress: Not on file  Social Connections: Not on file  Intimate Partner Violence: Not on file    Subjective: Review of Systems  Constitutional:  Negative for chills and fever.  HENT:  Negative for congestion and hearing loss.   Eyes:  Negative for blurred vision and double vision.  Respiratory:  Negative for cough and shortness of breath.   Cardiovascular:  Negative for chest pain and palpitations.   Gastrointestinal:  Negative for abdominal pain, blood in stool, constipation, diarrhea, heartburn, melena and vomiting.  Genitourinary:  Negative for dysuria and urgency.  Musculoskeletal:  Negative for joint pain and myalgias.  Skin:  Negative for itching and rash.  Neurological:  Negative for dizziness and headaches.  Psychiatric/Behavioral:  Negative for depression. The patient is not nervous/anxious.       Objective: BP (!) 159/87   Pulse 87   Temp 98 F (36.7 C) (Temporal)   Ht 5\' 11"  (1.803 m)   Wt 254 lb (115.2 kg)   BMI 35.43 kg/m  Physical Exam Constitutional:      Appearance: Normal appearance.  HENT:     Head: Normocephalic and atraumatic.  Eyes:     Extraocular Movements: Extraocular movements intact.     Conjunctiva/sclera: Conjunctivae normal.  Cardiovascular:     Rate and Rhythm: Normal rate and regular rhythm.  Pulmonary:     Effort: Pulmonary effort is normal.     Breath sounds: Normal breath sounds.  Abdominal:     General: Bowel sounds are normal.     Palpations: Abdomen is soft.  Musculoskeletal:        General: Normal range of motion.     Cervical back: Normal range of motion and neck supple.  Skin:    General: Skin is warm.  Neurological:     General: No focal deficit present.     Mental Status: He is alert and oriented to person, place, and time.  Psychiatric:        Mood and Affect: Mood normal.        Behavior: Behavior normal.     Assessment: *Diarrhea-resolved *Colon cancer screening  Plan: Patient's diarrhea has resolved with reducing stress in his life as well as diet changes.  States he is having normal bowel meds today.  We will continue to monitor for now.  Discussed colon cancer screening in depth with patient today as he has not had any screening modality in the past.  Recommended screening colonoscopy to which she said no.  Discussed Cologuard testing as an alternative option and he would like to hold off.  If he would like  to have either of these these done, all he has to do is call the office and we will set it up.  If screening colonoscopy, ASA 2.  Otherwise follow-up as needed.  Thank you Austin Calderon for the kind referral.  06/01/2021 11:38 AM   Disclaimer: This note was dictated with voice recognition software. Similar sounding words can inadvertently be transcribed and may not be corrected upon review.

## 2021-07-11 ENCOUNTER — Ambulatory Visit (INDEPENDENT_AMBULATORY_CARE_PROVIDER_SITE_OTHER): Payer: 59 | Admitting: Internal Medicine

## 2021-08-21 ENCOUNTER — Ambulatory Visit (INDEPENDENT_AMBULATORY_CARE_PROVIDER_SITE_OTHER): Payer: 59 | Admitting: Dermatology

## 2021-08-21 ENCOUNTER — Encounter: Payer: Self-pay | Admitting: Dermatology

## 2021-08-21 ENCOUNTER — Other Ambulatory Visit: Payer: Self-pay

## 2021-08-21 DIAGNOSIS — D229 Melanocytic nevi, unspecified: Secondary | ICD-10-CM

## 2021-08-21 DIAGNOSIS — D225 Melanocytic nevi of trunk: Secondary | ICD-10-CM

## 2021-08-21 DIAGNOSIS — L219 Seborrheic dermatitis, unspecified: Secondary | ICD-10-CM

## 2021-08-21 DIAGNOSIS — Z1283 Encounter for screening for malignant neoplasm of skin: Secondary | ICD-10-CM | POA: Diagnosis not present

## 2021-08-21 DIAGNOSIS — L409 Psoriasis, unspecified: Secondary | ICD-10-CM

## 2021-08-21 MED ORDER — HYDROCORTISONE 2.5 % EX CREA: TOPICAL_CREAM | CUTANEOUS | 1 refills | Status: AC

## 2021-08-21 MED ORDER — CLOBETASOL PROPIONATE 0.05 % EX FOAM: CUTANEOUS | 1 refills | Status: AC

## 2021-09-06 ENCOUNTER — Encounter: Payer: Self-pay | Admitting: Dermatology

## 2021-09-06 NOTE — Progress Notes (Signed)
° °  New Patient   Subjective  Austin Calderon is a 65 y.o. male who presents for the following: Skin Problem (Pt states he has very dry skin on the full face and under chin for about a yr. Pt states that there is some discomfort on the forehead/Pt states theres is no family of personal hx of skin cancer).  Rash with scale on face and scalp.  Check moles. Location:  Duration:  Quality:  Associated Signs/Symptoms: Modifying Factors:  Severity:  Timing: Context:    The following portions of the chart were reviewed this encounter and updated as appropriate:  Tobacco   Allergies   Meds   Problems   Med Hx   Surg Hx   Fam Hx       Objective  Well appearing patient in no apparent distress; mood and affect are within normal limits. Scalp Clinically has a combination of localized scalp  psoriasis plus predominantly facial seborrheic dermatitis and sometimes called seborrhiasis.  I explained that this was nothing he was doing incorrectly and can be treated, but there is no true cure.  Head - Anterior (Face) Subtle erythema with scale involving roughly half of face, predominantly centrally.  Right Lower Back General waist up skin examination 1 slightly larger and irregular smooth dermal papule right upper back shows no dermoscopic atypia but will be rechecked if there is change.       All skin waist up examined.   Assessment & Plan  Psoriasis Scalp  Use Clobetasol Foam daily for scalp only (avoid use on face) and  hydrocortisone ointment nightly for face for the next month.  Follow-up by MyChart or phone at that time  clobetasol (OLUX) 0.05 % topical foam - Scalp APPLY TO SCALP NIGHTLY (not on face)  Seborrheic dermatitis Head - Anterior (Face)  Use Hytone Ointment nightly  hydrocortisone 2.5 % cream - Head - Anterior (Face) APPLY TO FACE NIGHTLY  Nevus Right Lower Back  Annual skin examination

## 2021-09-13 DIAGNOSIS — M1711 Unilateral primary osteoarthritis, right knee: Secondary | ICD-10-CM | POA: Diagnosis not present

## 2021-09-13 DIAGNOSIS — Z0189 Encounter for other specified special examinations: Secondary | ICD-10-CM | POA: Diagnosis not present

## 2021-09-13 DIAGNOSIS — G8929 Other chronic pain: Secondary | ICD-10-CM | POA: Diagnosis not present

## 2021-09-13 DIAGNOSIS — K219 Gastro-esophageal reflux disease without esophagitis: Secondary | ICD-10-CM | POA: Diagnosis not present

## 2021-09-13 DIAGNOSIS — G4700 Insomnia, unspecified: Secondary | ICD-10-CM | POA: Diagnosis not present

## 2021-09-13 DIAGNOSIS — I4510 Unspecified right bundle-branch block: Secondary | ICD-10-CM | POA: Diagnosis not present

## 2021-09-13 DIAGNOSIS — E785 Hyperlipidemia, unspecified: Secondary | ICD-10-CM | POA: Diagnosis not present

## 2021-09-13 DIAGNOSIS — I10 Essential (primary) hypertension: Secondary | ICD-10-CM | POA: Diagnosis not present

## 2021-09-13 DIAGNOSIS — L409 Psoriasis, unspecified: Secondary | ICD-10-CM | POA: Diagnosis not present

## 2021-09-13 DIAGNOSIS — L219 Seborrheic dermatitis, unspecified: Secondary | ICD-10-CM | POA: Diagnosis not present

## 2021-09-29 DIAGNOSIS — E559 Vitamin D deficiency, unspecified: Secondary | ICD-10-CM | POA: Diagnosis not present

## 2021-09-29 DIAGNOSIS — R7303 Prediabetes: Secondary | ICD-10-CM | POA: Diagnosis not present

## 2021-09-29 DIAGNOSIS — M1711 Unilateral primary osteoarthritis, right knee: Secondary | ICD-10-CM | POA: Diagnosis not present

## 2021-09-29 DIAGNOSIS — I10 Essential (primary) hypertension: Secondary | ICD-10-CM | POA: Diagnosis not present

## 2021-09-29 DIAGNOSIS — Z131 Encounter for screening for diabetes mellitus: Secondary | ICD-10-CM | POA: Diagnosis not present

## 2021-10-04 DIAGNOSIS — E785 Hyperlipidemia, unspecified: Secondary | ICD-10-CM | POA: Diagnosis not present

## 2021-10-04 DIAGNOSIS — E559 Vitamin D deficiency, unspecified: Secondary | ICD-10-CM | POA: Diagnosis not present

## 2021-10-04 DIAGNOSIS — I10 Essential (primary) hypertension: Secondary | ICD-10-CM | POA: Diagnosis not present

## 2021-10-04 DIAGNOSIS — R7303 Prediabetes: Secondary | ICD-10-CM | POA: Diagnosis not present

## 2021-10-12 ENCOUNTER — Encounter: Payer: Self-pay | Admitting: Dermatology

## 2021-10-12 ENCOUNTER — Ambulatory Visit: Payer: Medicare Other | Admitting: Dermatology

## 2021-10-12 ENCOUNTER — Other Ambulatory Visit: Payer: Self-pay

## 2021-10-12 DIAGNOSIS — L219 Seborrheic dermatitis, unspecified: Secondary | ICD-10-CM | POA: Diagnosis not present

## 2021-10-12 DIAGNOSIS — Z5181 Encounter for therapeutic drug level monitoring: Secondary | ICD-10-CM | POA: Diagnosis not present

## 2021-10-12 DIAGNOSIS — R5383 Other fatigue: Secondary | ICD-10-CM

## 2021-10-12 DIAGNOSIS — L409 Psoriasis, unspecified: Secondary | ICD-10-CM | POA: Diagnosis not present

## 2021-10-12 NOTE — Patient Instructions (Signed)
Psoriasis  What causes psoriasis? A patients immune system plays a role in the development of psoriasis through the over-activity of a type of white blood cell called a T cell.  Once these cells are activated, a reaction is triggered that causes the skin to grow faster than normal.  New skin cells form in days instead of weeks, and these cells do not shed.  These cells pile up on the skin, and this results in what you see as psoriasis.  It is not contagious.  There is a genetic component to psoriasis, although not everyone inherits the gene.  What triggers psoriasis? Common triggers are stress, strep throat infection (more commonly in kids), and cold weather.  During stressful events, psoriasis tends to flare.  Certain medications such as lithium, some blood pressure medications, and some drugs used to treat malaria can trigger psoriasis.  A skin injury can also trigger psoriasis to develop at the site of injury.  Types of Psoriasis Plaque psoriasis:  80% of patients with psoriasis have plaque psoriasis.  Plaques usually form on elbows, knees, and lower back and appear raised and reddish with a silvery white scale. Nail psoriasis:  Fingernails and toenails may be affected.  Initially small pits may form, then with time, the nails thicken, lift up, and crumble.   Scalp psoriasis:  Similar in appearance to plaque psoriasis on the body.  Tends to be itchy.  Clinically can resemble dandruff because of the scales that can fall on a patients shirt.  This may be difficult to control. Pustular psoriasis:  Usually involves the palms and soles appearing as white, pus-filled bumps.  Rarely, it may develop all over the body, which can make patients seriously ill. Guttate psoriasis:  Usually occurs in children and young adults.  The lesions are smaller than plaque psoriasis.  May be triggered by a strep throat infection.  Many times it clears on its own, and the patient may or may not develop psoriasis again.    Inverse psoriasis:  Involves the folds of the skin and may be painful.  This appears as red, shiny patches.  It may involve the armpits, under the breasts, the genital area, or the crease of the buttock.  Psoriatic arthritis:  Up to one third of patients with psoriasis will develop arthritis.  It commonly affects the hands, feet, and spine, and may begin as stiffness.  If untreated it may lead to permanent joint damage.  Treatment There is no cure for psoriasis, but it can be controlled.  Some patients undergo more than one type of treatment.  Topical medications (applied externally to the skin) Corticosteroids:  typically first-line treatment for psoriasis.  They control the inflammation of psoriasis.  They are available as creams, ointments, sprays, foams, or lotions.  It is important to follow your dermatologists instructions as to how to apply the medicine.  For example, excessive use of strong steroid creams (especially to body creases and the face) can cause thinning and lightening of the skin.  This can lead to the formation of stretch marks in the folds.   Calcipotriene and calcipotriol (Vitamin D derivatives):  These are usually used in conjunction with a steroid cream.  Sometimes they may be used as maintenance once psoriasis is under control. Retinoids:  sometimes used in conjunction with a topical steroid cream.  Women should not use retinoids if they are pregnant. Coal tar:  an older treatment that is still effective, especially in combination with steroids.  It can be  messy and may have an odor in some preparations. Light treatments:  may provide a safe and effective treatment for psoriasis.  The main risks of light therapy are sunburn and possible increase in skin cancers.   Laser therapy:  can treat a certain stubborn area of psoriasis such as scalp, feet, and hands.  It is not used for large areas. Narrowband UVB:  A patient stands in front of panels of lights for a set amount of  time.  A typical course might be 24 treatments over 2 months.  PUVA:  This treatment combines exposure to UVA light with light-sensitizing medication called psoralen.  It may come as a pill or as a lotion.  The main side effects are nausea (from the psoralen pill) and significantly increased risk of skin cancer. Oral medications:  used for moderate to severe psoriasis.  These medications are very effective, but they have a number of potentially serious side effects. Methotrexate Soriatane (acitretin) Cyclosporine Biologic medications:  used for moderate to severe psoriasis.  These are newer medications that suppress the immune cells responsible for psoriasis.  They generally produce good results, but they all increase the risk of infection.  These are given as injections or IV infusions. Enbrel (etanercept) Humira (adalimumab) Remicade (infliximab) Stelara (ustekinumab)  Recent studies have found that patients with psoriasis are more prone to developing metabolic syndrome:  high blood pressure, coronary artery disease, high cholesterol, and diabetes.  It is very important for patients with psoriasis to live healthy lifestyles.  Diet and exercise are important.  Support groups:   www.psoriasis.org , http://www.[facility].com/psoriasisnet/index.html

## 2021-11-04 ENCOUNTER — Encounter: Payer: Self-pay | Admitting: Dermatology

## 2021-11-04 NOTE — Progress Notes (Signed)
° °  Follow-Up Visit   Subjective  Austin Calderon is a 65 y.o. male who presents for the following: Follow-up (Patient here today for follow up for psoriasis on scalp current treatment clobetasol foam and seborrheic dermatitis on his face treatment Hytone ointment. Per patient no improvement with either treatment. ).  Psoriasis Location:  Duration:  Quality:  Associated Signs/Symptoms: Modifying Factors:  Severity:  Timing: Context:   Objective  Well appearing patient in no apparent distress; mood and affect are within normal limits. Scalp Although he has less than 20% body surface area involved with psoriasis, it is quite symptomatic, unresponsive to conservative therapy, having a significant negative impact on quality of his life.  He is most interested in newer biologic therapies.    All skin waist up examined.   Assessment & Plan    Encounter for therapeutic drug monitoring  Related Procedures Comprehensive metabolic panel CBC with Differential/Platelet Hepatitis B surface antibody,quantitative Hepatitis B surface antigen Hepatitis B core antibody, total Hepatitis C antibody QuantiFERON-TB Gold Plus ANA  Psoriasis Scalp  PT IS TO START SKYRIZI: Risks and benefits in detail.  We will get baseline labs.  Comprehensive metabolic panel - Scalp  CBC with Differential/Platelet - Scalp  Hepatitis B surface antibody,quantitative - Scalp  Hepatitis B surface antigen - Scalp  Hepatitis B core antibody, total - Scalp  Hepatitis C antibody - Scalp  QuantiFERON-TB Gold Plus - Scalp  ANA - Scalp  Related Medications clobetasol (OLUX) 0.05 % topical foam APPLY TO SCALP NIGHTLY (not on face)  Seborrheic dermatitis Head - Anterior (Face)  Related Medications hydrocortisone 2.5 % cream APPLY TO FACE NIGHTLY  Other fatigue  Related Procedures QuantiFERON-TB Gold Plus      I, Lavonna Monarch, MD, have reviewed all documentation for this visit.  The  documentation on 11/04/21 for the exam, diagnosis, procedures, and orders are all accurate and complete.

## 2022-01-24 DIAGNOSIS — R7303 Prediabetes: Secondary | ICD-10-CM | POA: Diagnosis not present

## 2022-01-24 DIAGNOSIS — I10 Essential (primary) hypertension: Secondary | ICD-10-CM | POA: Diagnosis not present

## 2022-02-01 DIAGNOSIS — E559 Vitamin D deficiency, unspecified: Secondary | ICD-10-CM | POA: Diagnosis not present

## 2022-02-01 DIAGNOSIS — R7303 Prediabetes: Secondary | ICD-10-CM | POA: Diagnosis not present

## 2022-02-01 DIAGNOSIS — Z5320 Procedure and treatment not carried out because of patient's decision for unspecified reasons: Secondary | ICD-10-CM | POA: Diagnosis not present

## 2022-02-01 DIAGNOSIS — I10 Essential (primary) hypertension: Secondary | ICD-10-CM | POA: Diagnosis not present

## 2022-02-01 DIAGNOSIS — E785 Hyperlipidemia, unspecified: Secondary | ICD-10-CM | POA: Diagnosis not present

## 2022-02-01 DIAGNOSIS — R944 Abnormal results of kidney function studies: Secondary | ICD-10-CM | POA: Diagnosis not present

## 2022-05-03 DIAGNOSIS — E559 Vitamin D deficiency, unspecified: Secondary | ICD-10-CM | POA: Diagnosis not present

## 2022-05-03 DIAGNOSIS — I10 Essential (primary) hypertension: Secondary | ICD-10-CM | POA: Diagnosis not present

## 2022-05-03 DIAGNOSIS — R7303 Prediabetes: Secondary | ICD-10-CM | POA: Diagnosis not present

## 2022-05-10 DIAGNOSIS — R7303 Prediabetes: Secondary | ICD-10-CM | POA: Diagnosis not present

## 2022-05-10 DIAGNOSIS — I10 Essential (primary) hypertension: Secondary | ICD-10-CM | POA: Diagnosis not present

## 2022-05-10 DIAGNOSIS — R944 Abnormal results of kidney function studies: Secondary | ICD-10-CM | POA: Diagnosis not present

## 2022-05-10 DIAGNOSIS — E785 Hyperlipidemia, unspecified: Secondary | ICD-10-CM | POA: Diagnosis not present

## 2022-05-10 DIAGNOSIS — Z0001 Encounter for general adult medical examination with abnormal findings: Secondary | ICD-10-CM | POA: Diagnosis not present

## 2022-05-10 DIAGNOSIS — E559 Vitamin D deficiency, unspecified: Secondary | ICD-10-CM | POA: Diagnosis not present

## 2022-05-10 DIAGNOSIS — Z5320 Procedure and treatment not carried out because of patient's decision for unspecified reasons: Secondary | ICD-10-CM | POA: Diagnosis not present

## 2022-11-12 DIAGNOSIS — E559 Vitamin D deficiency, unspecified: Secondary | ICD-10-CM | POA: Diagnosis not present

## 2022-11-12 DIAGNOSIS — R7303 Prediabetes: Secondary | ICD-10-CM | POA: Diagnosis not present

## 2022-11-12 DIAGNOSIS — I10 Essential (primary) hypertension: Secondary | ICD-10-CM | POA: Diagnosis not present

## 2022-11-20 DIAGNOSIS — I10 Essential (primary) hypertension: Secondary | ICD-10-CM | POA: Diagnosis not present

## 2022-11-20 DIAGNOSIS — E1165 Type 2 diabetes mellitus with hyperglycemia: Secondary | ICD-10-CM | POA: Diagnosis not present

## 2022-11-20 DIAGNOSIS — E559 Vitamin D deficiency, unspecified: Secondary | ICD-10-CM | POA: Diagnosis not present

## 2022-11-20 DIAGNOSIS — E785 Hyperlipidemia, unspecified: Secondary | ICD-10-CM | POA: Diagnosis not present

## 2022-11-20 DIAGNOSIS — Z87891 Personal history of nicotine dependence: Secondary | ICD-10-CM | POA: Diagnosis not present

## 2022-11-20 DIAGNOSIS — R944 Abnormal results of kidney function studies: Secondary | ICD-10-CM | POA: Diagnosis not present

## 2022-11-20 DIAGNOSIS — Z0001 Encounter for general adult medical examination with abnormal findings: Secondary | ICD-10-CM | POA: Diagnosis not present

## 2023-05-17 DIAGNOSIS — E559 Vitamin D deficiency, unspecified: Secondary | ICD-10-CM | POA: Diagnosis not present

## 2023-05-17 DIAGNOSIS — I10 Essential (primary) hypertension: Secondary | ICD-10-CM | POA: Diagnosis not present

## 2023-05-17 DIAGNOSIS — E1165 Type 2 diabetes mellitus with hyperglycemia: Secondary | ICD-10-CM | POA: Diagnosis not present

## 2023-05-29 DIAGNOSIS — I10 Essential (primary) hypertension: Secondary | ICD-10-CM | POA: Diagnosis not present

## 2023-05-29 DIAGNOSIS — E1165 Type 2 diabetes mellitus with hyperglycemia: Secondary | ICD-10-CM | POA: Diagnosis not present

## 2023-05-29 DIAGNOSIS — R944 Abnormal results of kidney function studies: Secondary | ICD-10-CM | POA: Diagnosis not present

## 2023-05-29 DIAGNOSIS — Z5320 Procedure and treatment not carried out because of patient's decision for unspecified reasons: Secondary | ICD-10-CM | POA: Diagnosis not present

## 2023-05-29 DIAGNOSIS — E559 Vitamin D deficiency, unspecified: Secondary | ICD-10-CM | POA: Diagnosis not present

## 2023-05-29 DIAGNOSIS — E785 Hyperlipidemia, unspecified: Secondary | ICD-10-CM | POA: Diagnosis not present

## 2023-07-04 DIAGNOSIS — Z1211 Encounter for screening for malignant neoplasm of colon: Secondary | ICD-10-CM | POA: Diagnosis not present

## 2023-07-23 ENCOUNTER — Encounter: Payer: Self-pay | Admitting: *Deleted

## 2023-11-20 DIAGNOSIS — E1165 Type 2 diabetes mellitus with hyperglycemia: Secondary | ICD-10-CM | POA: Diagnosis not present

## 2023-11-20 DIAGNOSIS — I10 Essential (primary) hypertension: Secondary | ICD-10-CM | POA: Diagnosis not present

## 2023-11-20 DIAGNOSIS — E559 Vitamin D deficiency, unspecified: Secondary | ICD-10-CM | POA: Diagnosis not present

## 2023-11-26 DIAGNOSIS — R944 Abnormal results of kidney function studies: Secondary | ICD-10-CM | POA: Diagnosis not present

## 2023-11-26 DIAGNOSIS — E1165 Type 2 diabetes mellitus with hyperglycemia: Secondary | ICD-10-CM | POA: Diagnosis not present

## 2023-11-26 DIAGNOSIS — E785 Hyperlipidemia, unspecified: Secondary | ICD-10-CM | POA: Diagnosis not present

## 2023-11-26 DIAGNOSIS — E559 Vitamin D deficiency, unspecified: Secondary | ICD-10-CM | POA: Diagnosis not present

## 2023-11-26 DIAGNOSIS — Z713 Dietary counseling and surveillance: Secondary | ICD-10-CM | POA: Diagnosis not present

## 2023-11-26 DIAGNOSIS — I10 Essential (primary) hypertension: Secondary | ICD-10-CM | POA: Diagnosis not present

## 2023-11-26 DIAGNOSIS — Z87891 Personal history of nicotine dependence: Secondary | ICD-10-CM | POA: Diagnosis not present

## 2023-11-26 DIAGNOSIS — Z79899 Other long term (current) drug therapy: Secondary | ICD-10-CM | POA: Diagnosis not present

## 2023-12-04 DIAGNOSIS — H6092 Unspecified otitis externa, left ear: Secondary | ICD-10-CM | POA: Diagnosis not present

## 2024-01-21 ENCOUNTER — Encounter (INDEPENDENT_AMBULATORY_CARE_PROVIDER_SITE_OTHER): Payer: Self-pay | Admitting: *Deleted

## 2024-04-15 DIAGNOSIS — R350 Frequency of micturition: Secondary | ICD-10-CM | POA: Diagnosis not present

## 2024-05-04 ENCOUNTER — Encounter (HOSPITAL_COMMUNITY): Payer: Self-pay | Admitting: Internal Medicine

## 2024-05-04 DIAGNOSIS — Z87891 Personal history of nicotine dependence: Secondary | ICD-10-CM | POA: Diagnosis not present

## 2024-05-04 DIAGNOSIS — R3129 Other microscopic hematuria: Secondary | ICD-10-CM | POA: Diagnosis not present

## 2024-05-05 ENCOUNTER — Other Ambulatory Visit (HOSPITAL_COMMUNITY): Payer: Self-pay | Admitting: Family Medicine

## 2024-05-05 DIAGNOSIS — R3129 Other microscopic hematuria: Secondary | ICD-10-CM

## 2024-05-20 ENCOUNTER — Encounter (HOSPITAL_COMMUNITY): Payer: Self-pay

## 2024-05-20 ENCOUNTER — Ambulatory Visit (HOSPITAL_COMMUNITY): Admission: RE | Admit: 2024-05-20 | Source: Ambulatory Visit

## 2024-05-22 DIAGNOSIS — E1165 Type 2 diabetes mellitus with hyperglycemia: Secondary | ICD-10-CM | POA: Diagnosis not present

## 2024-05-22 DIAGNOSIS — E559 Vitamin D deficiency, unspecified: Secondary | ICD-10-CM | POA: Diagnosis not present

## 2024-05-28 DIAGNOSIS — E559 Vitamin D deficiency, unspecified: Secondary | ICD-10-CM | POA: Diagnosis not present

## 2024-05-28 DIAGNOSIS — E785 Hyperlipidemia, unspecified: Secondary | ICD-10-CM | POA: Diagnosis not present

## 2024-05-28 DIAGNOSIS — Z5320 Procedure and treatment not carried out because of patient's decision for unspecified reasons: Secondary | ICD-10-CM | POA: Diagnosis not present

## 2024-05-28 DIAGNOSIS — I10 Essential (primary) hypertension: Secondary | ICD-10-CM | POA: Diagnosis not present

## 2024-05-28 DIAGNOSIS — E1165 Type 2 diabetes mellitus with hyperglycemia: Secondary | ICD-10-CM | POA: Diagnosis not present

## 2024-05-28 DIAGNOSIS — R944 Abnormal results of kidney function studies: Secondary | ICD-10-CM | POA: Diagnosis not present

## 2024-06-10 ENCOUNTER — Ambulatory Visit (HOSPITAL_COMMUNITY)
Admission: RE | Admit: 2024-06-10 | Discharge: 2024-06-10 | Disposition: A | Discharge status: Home / Self Care | Source: Ambulatory Visit | Attending: Family Medicine | Admitting: Family Medicine

## 2024-06-10 DIAGNOSIS — N3289 Other specified disorders of bladder: Secondary | ICD-10-CM | POA: Diagnosis not present

## 2024-06-10 DIAGNOSIS — R160 Hepatomegaly, not elsewhere classified: Secondary | ICD-10-CM | POA: Diagnosis not present

## 2024-06-10 DIAGNOSIS — R3129 Other microscopic hematuria: Secondary | ICD-10-CM | POA: Insufficient documentation

## 2024-06-10 MED ORDER — IOHEXOL 300 MG/ML  SOLN
125.0000 mL | Freq: Once | INTRAMUSCULAR | Status: AC | PRN
Start: 2024-06-10 — End: 2024-06-10
  Administered 2024-06-10: 125 mL via INTRAVENOUS

## 2024-06-16 LAB — POCT I-STAT CREATININE: Creatinine, Ser: 1 mg/dL (ref 0.61–1.24)

## 2024-07-13 ENCOUNTER — Ambulatory Visit: Admitting: Urology

## 2024-07-13 VITALS — BP 135/77 | HR 92 | Wt 270.0 lb

## 2024-07-13 DIAGNOSIS — N400 Enlarged prostate without lower urinary tract symptoms: Secondary | ICD-10-CM | POA: Diagnosis not present

## 2024-07-13 DIAGNOSIS — N138 Other obstructive and reflux uropathy: Secondary | ICD-10-CM

## 2024-07-13 DIAGNOSIS — R350 Frequency of micturition: Secondary | ICD-10-CM

## 2024-07-13 DIAGNOSIS — R3129 Other microscopic hematuria: Secondary | ICD-10-CM | POA: Diagnosis not present

## 2024-07-13 LAB — URINALYSIS, ROUTINE W REFLEX MICROSCOPIC
Bilirubin, UA: NEGATIVE
Glucose, UA: NEGATIVE
Ketones, UA: NEGATIVE
Leukocytes,UA: NEGATIVE
Nitrite, UA: NEGATIVE
Protein,UA: NEGATIVE
RBC, UA: NEGATIVE
Specific Gravity, UA: 1.015 (ref 1.005–1.030)
Urobilinogen, Ur: 1 mg/dL (ref 0.2–1.0)
pH, UA: 7 (ref 5.0–7.5)

## 2024-07-13 NOTE — Progress Notes (Unsigned)
 07/13/2024 1:19 PM   Austin Calderon 05/06/1956 [id number]  Referring provider: Shona Norleen PEDLAR, MD [address] [place],  [place] [address]  No chief complaint on file.   HPI:  New patient-  1) microscopic hematuria-patient referred for microscopic hematuria.  August 2025 UA with greater than 30 red cells. September 2025 creatinine 1.06.  Hematocrit 47.6. Oct 2025 CT a/p -  Adrenal glands and kidneys are unremarkable. No urinary stones. Intrarenal collecting systems are suboptimally opacified, limiting evaluation. No filling defects in the ureters. Anterolateral bladder wall thickening with focal thickening along the right lateral bladder wall. Resolution is somewhat degraded by a left hip arthroplasty.  Remote smoker but smoked 20 yrs - quit 20 yrs ago.   2) BPH-His 2022 PSA was 0.25. No h/o BPH surgery.  He had frequency and urgency.  He started tamsulosin. Also treated with antibiotics. LUTS improved. Stopped tamsulosin.   Today, Austin Calderon seen for the above.  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   PMH: Past Medical History:  Diagnosis Date   Chronic back pain    Family history of adverse reaction to anesthesia    sister gets sick with anesthesia   GERD (gastroesophageal reflux disease)    hx of-was on meds yrs ago   History of gastric ulcer    HTN (hypertension)    was on Norvasc  but has been off for several months   Nocturia    RBBB (right bundle branch block)    Weakness    both legs occasionally    Surgical History: Past Surgical History:  Procedure Laterality Date   BACK SURGERY     ESOPHAGOGASTRODUODENOSCOPY     HARDWARE REMOVAL Left 01/07/2015   Procedure: HARDWARE REMOVAL LEFT L4-5 PEDICLE SCREWS;  Surgeon: Victory Gunnels, MD;  Location: [facility];  Service: Neurosurgery;  Laterality: Left;  HARDWARE REMOVAL LEFT L4-5 PEDICLE SCREWS   KNEE ARTHROSCOPY Right    LUMBAR FUSION     SPINAL FUSION      Home Medications:  Allergies as of  07/13/2024       Reactions   Valium  [diazepam ] Other (See Comments)   makes me crazy        Medication List        Accurate as of July 13, 2024  1:19 PM. If you have any questions, ask your nurse or doctor.          amLODipine  10 MG tablet Commonly known as: NORVASC  TAKE 1 TABLET(10 MG) BY MOUTH DAILY   atorvastatin  10 MG tablet Commonly known as: LIPITOR Take 10 mg by mouth daily.   clobetasol  0.05 % topical foam Commonly known as: OLUX  APPLY TO SCALP NIGHTLY (not on face)   diphenhydramine -acetaminophen  25-500 MG Tabs tablet Commonly known as: TYLENOL  PM Take 2 tablets by mouth at bedtime as needed.   hydrocortisone  2.5 % cream APPLY TO FACE NIGHTLY   mirtazapine  30 MG tablet Commonly known as: REMERON  TAKE 1 TABLET(30 MG) BY MOUTH AT BEDTIME   MULTIPLE VITAMIN PO Take 1 capsule by mouth daily.   Vitamin D (Ergocalciferol) 1.25 MG (50000 UNIT) Caps capsule Commonly known as: DRISDOL Take 50,000 Units by mouth once a week.        Allergies:  Allergies  Allergen Reactions   Valium  [Diazepam ] Other (See Comments)    makes me crazy    Family History: Family History  Problem Relation Age of Onset   Breast cancer Mother  Social History:  reports that he has quit smoking. He has never used smokeless tobacco. He reports current alcohol use. He reports that he does not use drugs.   Physical Exam: BP 135/77   Pulse 92   Wt 270 lb (122.5 kg)   BMI 37.66 kg/m   Constitutional:  Alert and oriented, No acute distress. HEENT: Austin Calderon AT, moist mucus membranes.  Trachea midline, no masses. Cardiovascular: No clubbing, cyanosis, or edema. Respiratory: Normal respiratory effort, no increased work of breathing. GI: Abdomen is soft, nontender, nondistended, no abdominal masses GU: No CVA tenderness Skin: No rashes, bruises or suspicious lesions. Neurologic: Grossly intact, no focal deficits, moving all 4 extremities. Psychiatric: Normal mood and  affect.  Laboratory Data: Lab Results  Component Value Date   WBC 9.1 02/08/2021   HGB 16.6 02/08/2021   HCT 51.2 (H) 02/08/2021   MCV 99.2 02/08/2021   PLT 238 02/08/2021    Lab Results  Component Value Date   CREATININE 1.00 06/10/2024    Lab Results  Component Value Date   PSA 0.25 02/08/2021    No results found for: TESTOSTERONE  Lab Results  Component Value Date   HGBA1C 5.6 08/29/2020    Urinalysis    Component Value Date/Time   COLORURINE YELLOW 10/14/2014 2129   APPEARANCEUR CLEAR 10/14/2014 2129   LABSPEC 1.013 10/14/2014 2129   PHURINE 8.0 10/14/2014 2129   GLUCOSEU NEGATIVE 10/14/2014 2129   HGBUR NEGATIVE 10/14/2014 2129   BILIRUBINUR NEGATIVE 10/14/2014 2129   KETONESUR NEGATIVE 10/14/2014 2129   PROTEINUR NEGATIVE 10/14/2014 2129   UROBILINOGEN 0.2 10/14/2014 2129   NITRITE NEGATIVE 10/14/2014 2129   LEUKOCYTESUR NEGATIVE 10/14/2014 2129    No results found for: LABMICR, WBCUA, RBCUA, LABEPIT, MUCUS, BACTERIA  Pertinent Imaging:  Assessment & Plan:    1. Microscopic hematuria (Primary) Discussed the etiology of microscopic hematuria-benign versus malignant causes.  I recommended cystoscopy and we went over the nature risk benefits and alternatives.  All questions answered. UA clear today.   - Urinalysis, Routine w reflex microscopic; Future - Urinalysis, Routine w reflex microscopic  2. BPH - continue surveillance. PSA was sent.   No follow-ups on file.  Donnice Brooks, MD  [facility]  [address] [place], [place] [address] [phone]

## 2024-07-14 ENCOUNTER — Ambulatory Visit: Payer: Self-pay

## 2024-07-14 LAB — PSA: Prostate Specific Ag, Serum: 0.4 ng/mL (ref 0.0–4.0)

## 2024-07-27 ENCOUNTER — Ambulatory Visit: Admitting: Urology

## 2024-07-27 VITALS — BP 138/79 | HR 92

## 2024-07-27 DIAGNOSIS — D414 Neoplasm of uncertain behavior of bladder: Secondary | ICD-10-CM | POA: Diagnosis not present

## 2024-07-27 DIAGNOSIS — R3129 Other microscopic hematuria: Secondary | ICD-10-CM

## 2024-07-27 LAB — URINALYSIS, ROUTINE W REFLEX MICROSCOPIC
Bilirubin, UA: NEGATIVE
Glucose, UA: NEGATIVE
Ketones, UA: NEGATIVE
Leukocytes,UA: NEGATIVE
Nitrite, UA: NEGATIVE
Protein,UA: NEGATIVE
RBC, UA: NEGATIVE
Specific Gravity, UA: 1.015 (ref 1.005–1.030)
Urobilinogen, Ur: 0.2 mg/dL (ref 0.2–1.0)
pH, UA: 6 (ref 5.0–7.5)

## 2024-07-27 MED ORDER — CIPROFLOXACIN HCL 500 MG PO TABS
500.0000 mg | ORAL_TABLET | Freq: Once | ORAL | Status: AC
  Administered 2024-07-27: 500 mg via ORAL

## 2024-07-27 NOTE — Progress Notes (Unsigned)
   07/27/24  CC: No chief complaint on file.   HPI:  F/u -    1) microscopic hematuria-August 2025 UA with greater than 30 red cells. September 2025 creatinine 1.06.  Hematocrit 47.6. Oct 2025 CT a/p -  Adrenal glands and kidneys are unremarkable. No urinary stones. Intrarenal collecting systems are suboptimally opacified, limiting evaluation. No filling defects in the ureters. Anterolateral bladder wall thickening with focal thickening along the right lateral bladder wall. Resolution is somewhat degraded by a left hip arthroplasty.   Remote smoker but smoked 20 yrs - quit 20 yrs ago.    2) BPH-His 2022 PSA was 0.25. No h/o BPH surgery.  He had frequency and urgency.  He started tamsulosin. Also treated with antibiotics. LUTS improved. Stopped tamsulosin.    Today, Marcey seen for the above. Nov 2025 UA clear x 2 here. Here for cystoscopy. No dysuria or gross hematuria.   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   Blood pressure 138/79, pulse 92. NED. A&Ox3.   No respiratory distress   Abd soft, NT, ND Normal phallus with bilateral descended testicles  Cystoscopy Procedure Note  Patient identification was confirmed, informed consent was obtained, and patient was prepped using Betadine solution.  Lidocaine  jelly was administered per urethral meatus.     Pre-Procedure: - Inspection reveals a normal caliber ureteral meatus.  Procedure: The flexible cystoscope was introduced without difficulty - No urethral strictures/lesions are present. - Short and borderline obstructive prostate  -  bladder neck -normal - Bilateral ureteral orifices identified - Bladder mucosa revealed some cystitis changes left anterior and possibly some papillary tumor.  No other ulcers, tumors, or lesions - No bladder stones - No trabeculation  Retroflexion shows normal bladder neck.   Post-Procedure: - Patient tolerated the procedure well but was very uncomfortable.  I think that is another  reason he needs a better examination under anesthesia.  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  We went over the nature risk benefits and alternatives to cystoscopy with bladder biopsy/TURBT.  Technique will depend on visual analysis in the OR.  Also discussed postoperative instillation of gemcitabine if appropriate.  Discussed these lesions can be benign or malignant.  Discussed risk of bladder perforation and bleeding among others.  Discussed if malignant importance of long-term follow-up and would likely do well with the diazepam  for future office cystoscopy.  He would like to do the procedure in [place] at [facility] and I discussed my associate Dr. Sherrilee would be doing the procedure.  Again discussed the importance of the procedure to rule out cancer.  MH - likely related to above.   No follow-ups on file.  Donnice Brooks, MD
